# Patient Record
Sex: Female | Born: 1995 | Hispanic: No | Marital: Married | Smoking: Never smoker
Health system: Southern US, Community
[De-identification: ages and names within clinical notes are randomized; demographics above are authoritative.]

## PROBLEM LIST (undated history)

## (undated) ENCOUNTER — Inpatient Hospital Stay (HOSPITAL_COMMUNITY): Payer: Self-pay

## (undated) DIAGNOSIS — Z789 Other specified health status: Secondary | ICD-10-CM

## (undated) DIAGNOSIS — D649 Anemia, unspecified: Secondary | ICD-10-CM

## (undated) DIAGNOSIS — O24419 Gestational diabetes mellitus in pregnancy, unspecified control: Secondary | ICD-10-CM

## (undated) HISTORY — DX: Anemia, unspecified: D64.9

## (undated) HISTORY — DX: Gestational diabetes mellitus in pregnancy, unspecified control: O24.419

## (undated) HISTORY — PX: NO PAST SURGERIES: SHX2092

---

## 2016-12-23 ENCOUNTER — Inpatient Hospital Stay (HOSPITAL_COMMUNITY)
Admission: AD | Admit: 2016-12-23 | Discharge: 2016-12-23 | Disposition: A | Discharge status: Home / Self Care | Payer: BLUE CROSS/BLUE SHIELD | Source: Ambulatory Visit | Attending: Obstetrics & Gynecology | Admitting: Obstetrics & Gynecology

## 2016-12-23 ENCOUNTER — Encounter (HOSPITAL_COMMUNITY): Payer: Self-pay | Admitting: *Deleted

## 2016-12-23 ENCOUNTER — Inpatient Hospital Stay (HOSPITAL_COMMUNITY): Payer: BLUE CROSS/BLUE SHIELD

## 2016-12-23 DIAGNOSIS — O26851 Spotting complicating pregnancy, first trimester: Secondary | ICD-10-CM | POA: Diagnosis not present

## 2016-12-23 DIAGNOSIS — O26892 Other specified pregnancy related conditions, second trimester: Secondary | ICD-10-CM | POA: Insufficient documentation

## 2016-12-23 DIAGNOSIS — O3442 Maternal care for other abnormalities of cervix, second trimester: Secondary | ICD-10-CM | POA: Diagnosis not present

## 2016-12-23 DIAGNOSIS — M549 Dorsalgia, unspecified: Secondary | ICD-10-CM | POA: Diagnosis not present

## 2016-12-23 DIAGNOSIS — N841 Polyp of cervix uteri: Secondary | ICD-10-CM | POA: Diagnosis not present

## 2016-12-23 DIAGNOSIS — O26859 Spotting complicating pregnancy, unspecified trimester: Secondary | ICD-10-CM

## 2016-12-23 DIAGNOSIS — Z3491 Encounter for supervision of normal pregnancy, unspecified, first trimester: Secondary | ICD-10-CM

## 2016-12-23 DIAGNOSIS — O209 Hemorrhage in early pregnancy, unspecified: Secondary | ICD-10-CM | POA: Diagnosis present

## 2016-12-23 DIAGNOSIS — Z6790 Unspecified blood type, Rh positive: Secondary | ICD-10-CM | POA: Diagnosis not present

## 2016-12-23 DIAGNOSIS — Z3A01 Less than 8 weeks gestation of pregnancy: Secondary | ICD-10-CM | POA: Diagnosis not present

## 2016-12-23 HISTORY — DX: Other specified health status: Z78.9

## 2016-12-23 LAB — URINALYSIS, ROUTINE W REFLEX MICROSCOPIC
Bilirubin Urine: NEGATIVE
Glucose, UA: NEGATIVE mg/dL
Hgb urine dipstick: NEGATIVE
KETONES UR: NEGATIVE mg/dL
Leukocytes, UA: NEGATIVE
Nitrite: NEGATIVE
PH: 6 (ref 5.0–8.0)
PROTEIN: NEGATIVE mg/dL
Specific Gravity, Urine: 1.006 (ref 1.005–1.030)

## 2016-12-23 LAB — CBC
HCT: 32.9 % — ABNORMAL LOW (ref 36.0–46.0)
Hemoglobin: 11.8 g/dL — ABNORMAL LOW (ref 12.0–15.0)
MCH: 29.1 pg (ref 26.0–34.0)
MCHC: 35.9 g/dL (ref 30.0–36.0)
MCV: 81 fL (ref 78.0–100.0)
PLATELETS: 155 10*3/uL (ref 150–400)
RBC: 4.06 MIL/uL (ref 3.87–5.11)
RDW: 12.5 % (ref 11.5–15.5)
WBC: 6.1 10*3/uL (ref 4.0–10.5)

## 2016-12-23 LAB — HCG, QUANTITATIVE, PREGNANCY: HCG, BETA CHAIN, QUANT, S: 119140 m[IU]/mL — AB (ref <5)

## 2016-12-23 LAB — WET PREP, GENITAL
CLUE CELLS WET PREP: NONE SEEN
Sperm: NONE SEEN
TRICH WET PREP: NONE SEEN
Yeast Wet Prep HPF POC: NONE SEEN

## 2016-12-23 LAB — POCT PREGNANCY, URINE: Preg Test, Ur: POSITIVE — AB

## 2016-12-23 NOTE — MAU Note (Signed)
Found out preg.    lmp was Sept 15. Started bleeding 4 days, spotting. Not really any pain, back is a little sore. First visit is Nov 2, called office was told to come here.

## 2016-12-23 NOTE — MAU Provider Note (Signed)
History     CSN: [id number]  Arrival date and time: 12/23/16 1651   First Provider Initiated Contact with Patient 12/23/16 1746      Chief Complaint  Patient presents with  . Vaginal Bleeding  . Possible Pregnancy  . Back Pain   G1 @[redacted]w[redacted]d  by LMP here with spotting x4 days. Describes as pink or brown and enough to stain a pad. Denies abdominal or pelvic pain. No recent IC.    OB History    Gravida Para Term Preterm AB Living   1             SAB TAB Ectopic Multiple Live Births                  Past Medical History:  Diagnosis Date  . Medical history non-contributory     Past Surgical History:  Procedure Laterality Date  . NO PAST SURGERIES      Family History  Problem Relation Age of Onset  . Diabetes Brother   . Diabetes Paternal Grandfather   . Hypertension Paternal Grandfather     Social History  Substance Use Topics  . Smoking status: Never Smoker  . Smokeless tobacco: Never Used  . Alcohol use No    Allergies: No Known Allergies  No prescriptions prior to admission.    Review of Systems  Gastrointestinal: Negative for abdominal pain.  Genitourinary: Positive for vaginal bleeding.   Physical Exam   Blood pressure 121/79, pulse 92, temperature 98.3 F (36.8 C), temperature source Oral, resp. rate 16, height 5\' 3"  (1.6 m), weight 114 lb (51.7 kg), last menstrual period 11/09/2016, SpO2 100 %.  Physical Exam  Constitutional: She is oriented to person, place, and time. She appears well-developed and well-nourished. No distress.  HENT:  Head: Normocephalic and atraumatic.  Neck: Normal range of motion.  Respiratory: Effort normal. No respiratory distress.  GI: Soft. She exhibits no distension and no mass. There is no tenderness. There is no rebound and no guarding.  Genitourinary:  Genitourinary Comments: External: no lesions or erythema Vagina: rugated, pink, moist, scant brown discharge, ?cervical polyp protruding from os, cervix visually  closed Uterus: non enlarged, anteverted, non tender, no CMT Adnexae: no masses, no tenderness left, no tenderness right   Musculoskeletal: Normal range of motion.  Neurological: She is alert and oriented to person, place, and time.  Skin: Skin is warm and dry.  Psychiatric: She has a normal mood and affect.   Results for orders placed or performed during the hospital encounter of 12/23/16 (from the past 24 hour(s))  Urinalysis, Routine w reflex microscopic     Status: Abnormal   Collection Time: 12/23/16  5:22 PM  Result Value Ref Range   Color, Urine STRAW (A) YELLOW   APPearance CLEAR CLEAR   Specific Gravity, Urine 1.006 1.005 - 1.030   pH 6.0 5.0 - 8.0   Glucose, UA NEGATIVE NEGATIVE mg/dL   Hgb urine dipstick NEGATIVE NEGATIVE   Bilirubin Urine NEGATIVE NEGATIVE   Ketones, ur NEGATIVE NEGATIVE mg/dL   Protein, ur NEGATIVE NEGATIVE mg/dL   Nitrite NEGATIVE NEGATIVE   Leukocytes, UA NEGATIVE NEGATIVE   RBC / HPF 0-5 0 - 5 RBC/hpf   WBC, UA 0-5 0 - 5 WBC/hpf   Bacteria, UA RARE (A) NONE SEEN   Squamous Epithelial / LPF 0-5 (A) NONE SEEN   Mucus PRESENT   Pregnancy, urine POC     Status: Abnormal   Collection Time: 12/23/16  5:44 PM  Result  Value Ref Range   Preg Test, Ur POSITIVE (A) NEGATIVE  Wet prep, genital     Status: Abnormal   Collection Time: 12/23/16  5:56 PM  Result Value Ref Range   Yeast Wet Prep HPF POC NONE SEEN NONE SEEN   Trich, Wet Prep NONE SEEN NONE SEEN   Clue Cells Wet Prep HPF POC NONE SEEN NONE SEEN   WBC, Wet Prep HPF POC MANY (A) NONE SEEN   Sperm NONE SEEN   CBC     Status: Abnormal   Collection Time: 12/23/16  6:20 PM  Result Value Ref Range   WBC 6.1 4.0 - 10.5 K/uL   RBC 4.06 3.87 - 5.11 MIL/uL   Hemoglobin 11.8 (L) 12.0 - 15.0 g/dL   HCT 16.1 (L) 09.6 - 04.5 %   MCV 81.0 78.0 - 100.0 fL   MCH 29.1 26.0 - 34.0 pg   MCHC 35.9 30.0 - 36.0 g/dL   RDW 40.9 81.1 - 91.4 %   Platelets 155 150 - 400 K/uL  hCG, quantitative, pregnancy      Status: Abnormal   Collection Time: 12/23/16  6:20 PM  Result Value Ref Range   hCG, Beta Chain, Quant, S 119,140 (H) <5 mIU/mL  ABO/Rh     Status: None (Preliminary result)   Collection Time: 12/23/16  6:20 PM  Result Value Ref Range   ABO/RH(D) O POS    US Ob Comp Less 14 Wks  Result Date: 12/23/2016 CLINICAL DATA:  Vaginal bleeding. Early pregnancy. Estimated gestational age by last menstrual period equals 6 weeks 2 days EXAM: OBSTETRIC <14 WK [place] AND TRANSVAGINAL OB US TECHNIQUE: Both transabdominal and transvaginal ultrasound examinations were performed for complete evaluation of the gestation as well as the maternal uterus, adnexal regions, and pelvic cul-de-sac. Transvaginal technique was performed to assess early pregnancy. COMPARISON:  None. FINDINGS: Intrauterine gestational sac: Present Yolk sac:  Present Embryo:  Present Cardiac Activity: Present Heart Rate: 127  bpm CRL:  5.5  mm   6 w   2 d                  [place] EDC: 08/16/2017 Subchorionic hemorrhage:  None Maternal uterus/adnexae: RIGHT ovary not identified. LEFT ovary with corpus luteal cyst. Trace free fluid. IMPRESSION: 1. Single intrauterine gestation with embryo and normal cardiac activity. 2. Estimated gestational age by crown rump length equals 6 weeks 2 days. Electronically Signed   By: Genevive Bi M.D.   On: 12/23/2016 20:13   US Ob Transvaginal  Result Date: 12/23/2016 CLINICAL DATA:  Vaginal bleeding. Early pregnancy. Estimated gestational age by last menstrual period equals 6 weeks 2 days EXAM: OBSTETRIC <14 WK [place] AND TRANSVAGINAL OB US TECHNIQUE: Both transabdominal and transvaginal ultrasound examinations were performed for complete evaluation of the gestation as well as the maternal uterus, adnexal regions, and pelvic cul-de-sac. Transvaginal technique was performed to assess early pregnancy. COMPARISON:  None. FINDINGS: Intrauterine gestational sac: Present Yolk sac:  Present Embryo:  Present Cardiac Activity:  Present Heart Rate: 127  bpm CRL:  5.5  mm   6 w   2 d                  [place] EDC: 08/16/2017 Subchorionic hemorrhage:  None Maternal uterus/adnexae: RIGHT ovary not identified. LEFT ovary with corpus luteal cyst. Trace free fluid. IMPRESSION: 1. Single intrauterine gestation with embryo and normal cardiac activity. 2. Estimated gestational age by crown rump length equals 6 weeks 2 days.  Electronically Signed   By: Genevive BiStewart  Edmunds M.D.   On: 12/23/2016 20:13   MAU Course  Procedures  MDM Labs and US ordered and reviewed. Normal IUP on US. No SCH seen. Spotting could be caused by cervical polyp. Discussed findings with pt, reassured. Stable for discharge home.  Assessment and Plan   1. Normal intrauterine pregnancy on prenatal ultrasound in first trimester   2. Spotting affecting pregnancy   3. Blood type, Rh positive   4. Cervical polyp    Discharge home Follow up in Ob office in 4 days as planned SAB/bleeding precautions  Allergies as of 12/23/2016   No Known Allergies     Medication List    You have not been prescribed any medications.    Donette LarryMelanie Emersyn Kotarski, CNM 12/23/2016, 6:00 PM

## 2016-12-23 NOTE — Discharge Instructions (Signed)
Vaginal Bleeding During Pregnancy, First Trimester °A small amount of bleeding (spotting) from the vagina is common in early pregnancy. Sometimes the bleeding is normal and is not a problem, and sometimes it is a sign of something serious. Be sure to tell your doctor about any bleeding from your vagina right away. °Follow these instructions at home: °· Watch your condition for any changes. °· Follow your doctor's instructions about how active you can be. °· If you are on bed rest: °? You may need to stay in bed and only get up to use the bathroom. °? You may be allowed to do some activities. °? If you need help, make plans for someone to help you. °· Write down: °? The number of pads you use each day. °? How often you change pads. °? How soaked (saturated) your pads are. °· Do not use tampons. °· Do not douche. °· Do not have sex or orgasms until your doctor says it is okay. °· If you pass any tissue from your vagina, save the tissue so you can show it to your doctor. °· Only take medicines as told by your doctor. °· Do not take aspirin because it can make you bleed. °· Keep all follow-up visits as told by your doctor. °Contact a doctor if: °· You bleed from your vagina. °· You have cramps. °· You have labor pains. °· You have a fever that does not go away after you take medicine. °Get help right away if: °· You have very bad cramps in your back or belly (abdomen). °· You pass large clots or tissue from your vagina. °· You bleed more. °· You feel light-headed or weak. °· You pass out (faint). °· You have chills. °· You are leaking fluid or have a gush of fluid from your vagina. °· You pass out while pooping (having a bowel movement). °This information is not intended to replace advice given to you by your health care provider. Make sure you discuss any questions you have with your health care provider. °Document Released: 06/28/2013 Document Revised: 07/20/2015 Document Reviewed: 10/19/2012 °Elsevier Interactive  Patient Education © 2018 Elsevier Inc. ° °

## 2016-12-24 LAB — ABO/RH: ABO/RH(D): O POS

## 2016-12-24 LAB — GC/CHLAMYDIA PROBE AMP (~~LOC~~) NOT AT ARMC
CHLAMYDIA, DNA PROBE: NEGATIVE
NEISSERIA GONORRHEA: NEGATIVE

## 2016-12-27 LAB — OB RESULTS CONSOLE HEPATITIS B SURFACE ANTIGEN: HEP B S AG: NEGATIVE

## 2016-12-27 LAB — OB RESULTS CONSOLE HIV ANTIBODY (ROUTINE TESTING): HIV: NONREACTIVE

## 2016-12-27 LAB — OB RESULTS CONSOLE GC/CHLAMYDIA
Chlamydia: NEGATIVE
Gonorrhea: NEGATIVE

## 2016-12-27 LAB — OB RESULTS CONSOLE RPR: RPR: NONREACTIVE

## 2016-12-27 LAB — OB RESULTS CONSOLE ANTIBODY SCREEN: Antibody Screen: NEGATIVE

## 2016-12-27 LAB — OB RESULTS CONSOLE RUBELLA ANTIBODY, IGM: Rubella: IMMUNE

## 2016-12-27 LAB — OB RESULTS CONSOLE ABO/RH: RH TYPE: POSITIVE

## 2017-02-14 ENCOUNTER — Other Ambulatory Visit: Payer: Self-pay | Admitting: Obstetrics and Gynecology

## 2017-02-14 ENCOUNTER — Other Ambulatory Visit (HOSPITAL_COMMUNITY)
Admission: RE | Admit: 2017-02-14 | Discharge: 2017-02-14 | Disposition: A | Discharge status: Home / Self Care | Payer: BLUE CROSS/BLUE SHIELD | Source: Ambulatory Visit | Attending: Obstetrics and Gynecology | Admitting: Obstetrics and Gynecology

## 2017-02-14 DIAGNOSIS — Z124 Encounter for screening for malignant neoplasm of cervix: Secondary | ICD-10-CM | POA: Insufficient documentation

## 2017-02-25 NOTE — L&D Delivery Note (Signed)
Delivery Note At 2:14 PM a viable female was delivered via Vaginal, Spontaneous (Presentation: ;  Right occiput Anterior ).  APGAR: 8, 8; weight 5 lb 14.9 oz (2690 g).   Placenta status:  Complete 3 vessel .  Cord:  with the following complications None: .  Cord pH: NA  Anesthesia:None   Episiotomy: None Lacerations: Labial ( Right labial) Suture Repair: 3.0 vicryl Est. Blood Loss (mL): 200 cc    Mom to postpartum.  Baby to Couplet care / Skin to Skin.  Erika Kelly J. 08/06/2017, 5:29 PM

## 2017-02-26 LAB — CYTOLOGY - PAP
Chlamydia: NEGATIVE
DIAGNOSIS: NEGATIVE
NEISSERIA GONORRHEA: NEGATIVE

## 2017-05-21 ENCOUNTER — Ambulatory Visit: Payer: BLUE CROSS/BLUE SHIELD | Admitting: Registered"

## 2017-05-28 ENCOUNTER — Encounter: Payer: BLUE CROSS/BLUE SHIELD | Attending: Obstetrics and Gynecology | Admitting: Registered"

## 2017-05-28 DIAGNOSIS — O99810 Abnormal glucose complicating pregnancy: Secondary | ICD-10-CM | POA: Diagnosis not present

## 2017-05-28 DIAGNOSIS — Z3A00 Weeks of gestation of pregnancy not specified: Secondary | ICD-10-CM | POA: Insufficient documentation

## 2017-05-28 DIAGNOSIS — Z713 Dietary counseling and surveillance: Secondary | ICD-10-CM | POA: Insufficient documentation

## 2017-05-29 ENCOUNTER — Encounter: Payer: Self-pay | Admitting: Registered"

## 2017-05-29 DIAGNOSIS — O99810 Abnormal glucose complicating pregnancy: Secondary | ICD-10-CM | POA: Insufficient documentation

## 2017-05-29 NOTE — Progress Notes (Signed)
Patient was seen on 05/28/2017 for Gestational Diabetes self-management class at the [facility]. The following learning objectives were met by the patient during this course:   States the definition of Gestational Diabetes  States why dietary management is important in controlling blood glucose  Describes the effects each nutrient has on blood glucose levels  Demonstrates ability to create a balanced meal plan  Demonstrates carbohydrate counting   States when to check blood glucose levels  Demonstrates proper blood glucose monitoring techniques  States the effect of stress and exercise on blood glucose levels  States the importance of limiting caffeine and abstaining from alcohol and smoking  Blood glucose monitor given: none  Patient instructed to monitor glucose levels: FBS: 60 - <95; 1 hour: <140; 2 hour: <120  Patient received handouts:  Nutrition Diabetes and Pregnancy, including carb counting list  Patient will be seen for follow-up as needed.

## 2017-07-31 ENCOUNTER — Telehealth (HOSPITAL_COMMUNITY): Payer: Self-pay | Admitting: *Deleted

## 2017-07-31 ENCOUNTER — Encounter (HOSPITAL_COMMUNITY): Payer: Self-pay | Admitting: *Deleted

## 2017-07-31 NOTE — Telephone Encounter (Signed)
Preadmission screen  

## 2017-08-06 ENCOUNTER — Encounter (HOSPITAL_COMMUNITY): Payer: Self-pay | Admitting: *Deleted

## 2017-08-06 ENCOUNTER — Inpatient Hospital Stay (HOSPITAL_COMMUNITY)
Admission: AD | Admit: 2017-08-06 | Discharge: 2017-08-08 | DRG: 807 | Disposition: A | Discharge status: Home / Self Care | Payer: BLUE CROSS/BLUE SHIELD | Attending: Obstetrics and Gynecology | Admitting: Obstetrics and Gynecology

## 2017-08-06 DIAGNOSIS — O358XX0 Maternal care for other (suspected) fetal abnormality and damage, not applicable or unspecified: Secondary | ICD-10-CM | POA: Diagnosis present

## 2017-08-06 DIAGNOSIS — Z3A38 38 weeks gestation of pregnancy: Secondary | ICD-10-CM | POA: Diagnosis not present

## 2017-08-06 DIAGNOSIS — Z3483 Encounter for supervision of other normal pregnancy, third trimester: Secondary | ICD-10-CM | POA: Diagnosis present

## 2017-08-06 DIAGNOSIS — O365930 Maternal care for other known or suspected poor fetal growth, third trimester, not applicable or unspecified: Secondary | ICD-10-CM | POA: Diagnosis present

## 2017-08-06 DIAGNOSIS — O24420 Gestational diabetes mellitus in childbirth, diet controlled: Secondary | ICD-10-CM | POA: Diagnosis present

## 2017-08-06 LAB — CBC
HCT: 41.5 % (ref 36.0–46.0)
HEMOGLOBIN: 14.1 g/dL (ref 12.0–15.0)
MCH: 28.2 pg (ref 26.0–34.0)
MCHC: 34 g/dL (ref 30.0–36.0)
MCV: 83 fL (ref 78.0–100.0)
Platelets: 106 10*3/uL — ABNORMAL LOW (ref 150–400)
RBC: 5 MIL/uL (ref 3.87–5.11)
RDW: 13.8 % (ref 11.5–15.5)
WBC: 8.4 10*3/uL (ref 4.0–10.5)

## 2017-08-06 LAB — TYPE AND SCREEN
ABO/RH(D): O POS
Antibody Screen: NEGATIVE

## 2017-08-06 MED ORDER — OXYCODONE-ACETAMINOPHEN 5-325 MG PO TABS: 1.0000 | ORAL_TABLET | ORAL | Status: DC | PRN

## 2017-08-06 MED ORDER — SENNOSIDES-DOCUSATE SODIUM 8.6-50 MG PO TABS
2.0000 | ORAL_TABLET | ORAL | Status: DC
  Administered 2017-08-07 – 2017-08-08 (×2): 2 via ORAL
  Filled 2017-08-06 (×2): qty 2

## 2017-08-06 MED ORDER — OXYTOCIN 40 UNITS IN LACTATED RINGERS INFUSION - SIMPLE MED
INTRAVENOUS | Status: AC
  Filled 2017-08-06: qty 1000

## 2017-08-06 MED ORDER — DIPHENHYDRAMINE HCL 25 MG PO CAPS: 25.0000 mg | ORAL_CAPSULE | Freq: Four times a day (QID) | ORAL | Status: DC | PRN

## 2017-08-06 MED ORDER — PRENATAL MULTIVITAMIN CH
1.0000 | ORAL_TABLET | Freq: Every day | ORAL | Status: DC
  Administered 2017-08-07 – 2017-08-08 (×2): 1 via ORAL
  Filled 2017-08-06 (×2): qty 1

## 2017-08-06 MED ORDER — METHYLERGONOVINE MALEATE 0.2 MG/ML IJ SOLN: 0.2000 mg | INTRAMUSCULAR | Status: DC | PRN

## 2017-08-06 MED ORDER — OXYCODONE-ACETAMINOPHEN 5-325 MG PO TABS: 2.0000 | ORAL_TABLET | ORAL | Status: DC | PRN

## 2017-08-06 MED ORDER — FLEET ENEMA 7-19 GM/118ML RE ENEM: 1.0000 | ENEMA | RECTAL | Status: DC | PRN

## 2017-08-06 MED ORDER — ONDANSETRON HCL 4 MG/2ML IJ SOLN: 4.0000 mg | Freq: Four times a day (QID) | INTRAMUSCULAR | Status: DC | PRN

## 2017-08-06 MED ORDER — ONDANSETRON HCL 4 MG/2ML IJ SOLN: 4.0000 mg | INTRAMUSCULAR | Status: DC | PRN

## 2017-08-06 MED ORDER — SOD CITRATE-CITRIC ACID 500-334 MG/5ML PO SOLN: 30.0000 mL | ORAL | Status: DC | PRN

## 2017-08-06 MED ORDER — BENZOCAINE-MENTHOL 20-0.5 % EX AERO: 1.0000 "application " | INHALATION_SPRAY | CUTANEOUS | Status: DC | PRN

## 2017-08-06 MED ORDER — DIBUCAINE 1 % RE OINT: 1.0000 "application " | TOPICAL_OINTMENT | RECTAL | Status: DC | PRN

## 2017-08-06 MED ORDER — WITCH HAZEL-GLYCERIN EX PADS: 1.0000 "application " | MEDICATED_PAD | CUTANEOUS | Status: DC | PRN

## 2017-08-06 MED ORDER — IBUPROFEN 600 MG PO TABS
600.0000 mg | ORAL_TABLET | Freq: Four times a day (QID) | ORAL | Status: DC
  Administered 2017-08-08: 600 mg via ORAL
  Filled 2017-08-06 (×6): qty 1

## 2017-08-06 MED ORDER — OXYTOCIN BOLUS FROM INFUSION: 500.0000 mL | Freq: Once | INTRAVENOUS | Status: DC

## 2017-08-06 MED ORDER — LIDOCAINE HCL (PF) 1 % IJ SOLN
30.0000 mL | INTRAMUSCULAR | Status: DC | PRN
  Filled 2017-08-06: qty 30

## 2017-08-06 MED ORDER — LACTATED RINGERS IV SOLN
INTRAVENOUS | Status: DC
  Administered 2017-08-06: 14:00:00 via INTRAVENOUS

## 2017-08-06 MED ORDER — SIMETHICONE 80 MG PO CHEW: 80.0000 mg | CHEWABLE_TABLET | ORAL | Status: DC | PRN

## 2017-08-06 MED ORDER — MISOPROSTOL 200 MCG PO TABS
ORAL_TABLET | ORAL | Status: AC
  Filled 2017-08-06: qty 5

## 2017-08-06 MED ORDER — OXYTOCIN 40 UNITS IN LACTATED RINGERS INFUSION - SIMPLE MED: 2.5000 [IU]/h | INTRAVENOUS | Status: DC

## 2017-08-06 MED ORDER — OXYCODONE HCL 5 MG PO TABS: 10.0000 mg | ORAL_TABLET | ORAL | Status: DC | PRN

## 2017-08-06 MED ORDER — OXYTOCIN 10 UNIT/ML IJ SOLN
INTRAMUSCULAR | Status: AC
  Administered 2017-08-06: 10 [IU]
  Filled 2017-08-06: qty 1

## 2017-08-06 MED ORDER — ZOLPIDEM TARTRATE 5 MG PO TABS: 5.0000 mg | ORAL_TABLET | Freq: Every evening | ORAL | Status: DC | PRN

## 2017-08-06 MED ORDER — ACETAMINOPHEN 325 MG PO TABS: 650.0000 mg | ORAL_TABLET | ORAL | Status: DC | PRN

## 2017-08-06 MED ORDER — LIDOCAINE HCL (PF) 1 % IJ SOLN
INTRAMUSCULAR | Status: AC
  Filled 2017-08-06: qty 30

## 2017-08-06 MED ORDER — METHYLERGONOVINE MALEATE 0.2 MG PO TABS: 0.2000 mg | ORAL_TABLET | ORAL | Status: DC | PRN

## 2017-08-06 MED ORDER — COCONUT OIL OIL
1.0000 "application " | TOPICAL_OIL | Status: DC | PRN
  Administered 2017-08-07: 1 via TOPICAL
  Filled 2017-08-06: qty 120

## 2017-08-06 MED ORDER — LACTATED RINGERS IV SOLN: 500.0000 mL | INTRAVENOUS | Status: DC | PRN

## 2017-08-06 MED ORDER — ONDANSETRON HCL 4 MG PO TABS
4.0000 mg | ORAL_TABLET | ORAL | Status: DC | PRN
Start: 2017-08-06 — End: 2017-08-08

## 2017-08-06 MED ORDER — OXYCODONE HCL 5 MG PO TABS: 5.0000 mg | ORAL_TABLET | ORAL | Status: DC | PRN

## 2017-08-06 NOTE — Lactation Note (Signed)
This note was copied from a baby's chart. Lactation Consultation Note  Patient Name: Boy Hosie Spangleyesha Labrosse ZHYQM'VToday's Date: 08/06/2017 Reason for consult: Initial assessment;Primapara;1st time breastfeeding;Early term 37-38.6wks;Infant < 6lbs  P1 mother whose infant is now 988 hours old.  This is an ETI at 38+4 weeks and weighs 4+14.9 oz.  Baby in bassinet and swaddled when I arrived.  Mother requested latch assistance.  Mother's breasts are soft and non tender and nipples are shor Maternal Data Formula Feeding for Exclusion: No Has patient been taught Hand Expression?: Yes Does the patient have breastfeeding experience prior to this delivery?: No  Feeding Feeding Type: Breast Fed Length of feed: 0 min  LATCH Score Latch: Too sleepy or reluctant, no latch achieved, no sucking elicited.  Audible Swallowing: None  Type of Nipple: Everted at rest and after stimulation  Comfort (Breast/Nipple): Soft / non-tender  Hold (Positioning): Assistance needed to correctly position infant at breast and maintain latch.  LATCH Score: 5  Interventions Interventions: Breast feeding basics reviewed;Assisted with latch;Skin to skin;Breast massage;Hand express;Position options;Support pillows;Adjust position;Breast compression  Lactation Tools Discussed/Used     Consult Status Consult Status: Follow-up Date: 08/07/17 Follow-up type: In-patient    Dora SimsBeth R Agam Davenport 08/06/2017, 10:43 PM

## 2017-08-06 NOTE — MAU Note (Signed)
First baby, gest diabetic diet controlled.  Started contracting earlier this morning.  Bloody show.

## 2017-08-06 NOTE — H&P (Signed)
Erika Kelly is a 22 y.o. female G1 P0 at 38 wks and  4 days  prHosie Spangleesenting for active labor. She progressed rapidly from 7 cm to complete. Pregnancy complicated by mild fetal pyelectasis , IUGR  and A1GDM. Prenatal care provided by Dr. Gerald Leitzara Anthonee Gelin [facility]   . OB History    Gravida  1   Para      Term      Preterm      AB      Living        SAB      TAB      Ectopic      Multiple      Live Births             Past Medical History:  Diagnosis Date  . Gestational diabetes   . Medical history non-contributory    Past Surgical History:  Procedure Laterality Date  . NO PAST SURGERIES     Family History: family history includes Diabetes in her brother and paternal grandfather; Heart disease in her maternal grandfather; Hypertension in her maternal grandfather, maternal grandmother, and paternal grandfather. Social History:  reports that she has never smoked. She has never used smokeless tobacco. She reports that she does not drink alcohol or use drugs.     Maternal Diabetes: Yes:  Diabetes Type:  Diet controlled Genetic Screening: Declined Maternal Ultrasounds/Referrals: Abnormal:  Findings:   Fetal renal pyelectasis Fetal Ultrasounds or other Referrals:  None Maternal Substance Abuse:  No Significant Maternal Medications:  None Significant Maternal Lab Results:  Lab values include: Group B Strep negative Other Comments:  None  Review of Systems  Constitutional: Negative.   HENT: Negative.   Eyes: Negative.   Respiratory: Negative.   Cardiovascular: Negative.   Gastrointestinal: Positive for abdominal pain.  Genitourinary: Negative.   Musculoskeletal: Negative.   Skin: Negative.   Neurological: Negative.   Endo/Heme/Allergies: Negative.   Psychiatric/Behavioral: Negative.    History Dilation: 10 Effacement (%): 100 Station: Plus 2 Exam by:: Hubert AzureM. WIlkins, RNC Blood pressure 113/77, pulse 81, temperature 98.4 F (36.9 C), resp. rate 18, height 5'  3" (1.6 m), weight 59 kg (130 lb), last menstrual period 11/09/2016. Exam Physical Exam  Constitutional: She is oriented to person, place, and time. She appears well-developed and well-nourished.  Eyes: Pupils are equal, round, and reactive to light. Conjunctivae are normal.  Neck: Normal range of motion. Neck supple.  Cardiovascular: Normal rate and regular rhythm.  Respiratory: Effort normal and breath sounds normal.  GI: There is no tenderness.  Genitourinary: Vagina normal.  Musculoskeletal: Normal range of motion. She exhibits no edema.  Neurological: She is alert and oriented to person, place, and time.  Skin: Skin is warm and dry.  Psychiatric: She has a normal mood and affect.    Prenatal labs: ABO, Rh: --/--/O POS (06/12 1400) Antibody: NEG (06/12 1400) Rubella: Immune (11/02 0000) RPR: Nonreactive (11/02 0000)  HBsAg: Negative (11/02 0000)  HIV: Non-reactive (11/02 0000)  GBS:   Negative  Assessment/Plan: 38 wks and 4 days  In active labor  Precipitous delivery upon arrival - see delivery summary A1GDM- check fasting BS in am .. Routine diet post delivery     Sho Salguero J. 08/06/2017, 5:22 PM

## 2017-08-06 NOTE — Lactation Note (Signed)
This note was copied from a baby's chart. Lactation Consultation Note  Patient Name: Erika Kelly UJWJX'BToday's Date: 08/06/2017 Reason for consult: Initial assessment;Primapara;1st time breastfeeding;Early term 37-38.6wks;Infant < 6lbs  P1 mother whose infant is now 338 hours old.  This is an ETI at 38+4 weeks and 5+14.9 oz  Mother is requesting latch assistance.  Mother's breasts are soft and non tender with short everted nipples.  Mother demonstrated hand expression and colostrum drops easily expressed.  Attempted to latch in the football hold on the left breast.  Baby could grasp breast but was not interested in sucking.  Finger fed a few drops of colostrum and attempted to latch again without success.  He is sleepy now and explained to mother that this is very normal.  Reviewed feeding cues with her and she will attempt to feed again when he shows cues.  Encouraged 8-12 feedings/24 hours or more if feeding cues are displayed.  STS, breast massage and hand expression reinforced.  Discussed voiding and stooling patterns of the newborn.  Reminded mother to keep feeding log current throughout the night.  Mother will call for assistance as needed.  Mom made aware of O/P services, breastfeeding support groups, community resources, and our phone # for post-discharge questions. Father present at the end of the feeding and updated.    Maternal Data Formula Feeding for Exclusion: No Has patient been taught Hand Expression?: Yes Does the patient have breastfeeding experience prior to this delivery?: No  Feeding Feeding Type: Breast Fed Length of feed: 0 min  LATCH Score Latch: Too sleepy or reluctant, no latch achieved, no sucking elicited.  Audible Swallowing: None  Type of Nipple: Everted at rest and after stimulation  Comfort (Breast/Nipple): Soft / non-tender  Hold (Positioning): Assistance needed to correctly position infant at breast and maintain latch.  LATCH Score:  5  Interventions Interventions: Breast feeding basics reviewed;Assisted with latch;Skin to skin;Breast massage;Hand express;Position options;Support pillows;Adjust position;Breast compression  Lactation Tools Discussed/Used     Consult Status Consult Status: Follow-up Date: 08/07/17 Follow-up type: In-patient    Dora SimsBeth R Marisol Glazer 08/06/2017, 10:46 PM

## 2017-08-07 ENCOUNTER — Inpatient Hospital Stay (HOSPITAL_COMMUNITY): Admission: RE | Admit: 2017-08-07 | Payer: BLUE CROSS/BLUE SHIELD | Source: Ambulatory Visit

## 2017-08-07 LAB — CBC
HEMATOCRIT: 36.2 % (ref 36.0–46.0)
Hemoglobin: 12.3 g/dL (ref 12.0–15.0)
MCH: 28.3 pg (ref 26.0–34.0)
MCHC: 34 g/dL (ref 30.0–36.0)
MCV: 83.2 fL (ref 78.0–100.0)
PLATELETS: 110 10*3/uL — AB (ref 150–400)
RBC: 4.35 MIL/uL (ref 3.87–5.11)
RDW: 13.6 % (ref 11.5–15.5)
WBC: 11.8 10*3/uL — AB (ref 4.0–10.5)

## 2017-08-07 LAB — RPR: RPR Ser Ql: NONREACTIVE

## 2017-08-07 NOTE — Discharge Instructions (Signed)
Postpartum Care After Vaginal Delivery °The period of time right after you deliver your newborn is called the postpartum period. °What kind of medical care will I receive? °· You may continue to receive fluids and medicines through an IV tube inserted into one of your veins. °· If an incision was made near your vagina (episiotomy) or if you had some vaginal tearing during delivery, cold compresses may be placed on your episiotomy or your tear. This helps to reduce pain and swelling. °· You may be given a squirt bottle to use when you go to the bathroom. You may use this until you are comfortable wiping as usual. To use the squirt bottle, follow these steps: °? Before you urinate, fill the squirt bottle with warm water. Do not use hot water. °? After you urinate, while you are sitting on the toilet, use the squirt bottle to rinse the area around your urethra and vaginal opening. This rinses away any urine and blood. °? You may do this instead of wiping. As you start healing, you may use the squirt bottle before wiping yourself. Make sure to wipe gently. °? Fill the squirt bottle with clean water every time you use the bathroom. °· You will be given sanitary pads to wear. °How can I expect to feel? °· You may not feel the need to urinate for several hours after delivery. °· You will have some soreness and pain in your abdomen and vagina. °· If you are breastfeeding, you may have uterine contractions every time you breastfeed for up to several weeks postpartum. Uterine contractions help your uterus return to its normal size. °· It is normal to have vaginal bleeding (lochia) after delivery. The amount and appearance of lochia is often similar to a menstrual period in the first week after delivery. It will gradually decrease over the next few weeks to a dry, yellow-brown discharge. For most women, lochia stops completely by 6-8 weeks after delivery. Vaginal bleeding can vary from woman to woman. °· Within the first few  days after delivery, you may have breast engorgement. This is when your breasts feel heavy, full, and uncomfortable. Your breasts may also throb and feel hard, tightly stretched, warm, and tender. After this occurs, you may have milk leaking from your breasts. Your health care provider can help you relieve discomfort due to breast engorgement. Breast engorgement should go away within a few days. °· You may feel more sad or worried than normal due to hormonal changes after delivery. These feelings should not last more than a few days. If these feelings do not go away after several days, speak with your health care provider. °How should I care for myself? °· Tell your health care provider if you have pain or discomfort. °· Drink enough water to keep your urine clear or pale yellow. °· Wash your hands thoroughly with soap and water for at least 20 seconds after changing your sanitary pads, after using the toilet, and before holding or feeding your baby. °· If you are not breastfeeding, avoid touching your breasts a lot. Doing this can make your breasts produce more milk. °· If you become weak or lightheaded, or you feel like you might faint, ask for help before: °? Getting out of bed. °? Showering. °· Change your sanitary pads frequently. Watch for any changes in your flow, such as a sudden increase in volume, a change in color, the passing of large blood clots. If you pass a blood clot from your vagina, save it   to show to your health care provider. Do not flush blood clots down the toilet without having your health care provider look at them. °· Make sure that all your vaccinations are up to date. This can help protect you and your baby from getting certain diseases. You may need to have immunizations done before you leave the hospital. °· If desired, talk with your health care provider about methods of family planning or birth control (contraception). °How can I start bonding with my baby? °Spending as much time as  possible with your baby is very important. During this time, you and your baby can get to know each other and develop a bond. Having your baby stay with you in your room (rooming in) can give you time to get to know your baby. Rooming in can also help you become comfortable caring for your baby. Breastfeeding can also help you bond with your baby. °How can I plan for returning home with my baby? °· Make sure that you have a car seat installed in your vehicle. °? Your car seat should be checked by a certified car seat installer to make sure that it is installed safely. °? Make sure that your baby fits into the car seat safely. °· Ask your health care provider any questions you have about caring for yourself or your baby. Make sure that you are able to contact your health care provider with any questions after leaving the hospital. °This information is not intended to replace advice given to you by your health care provider. Make sure you discuss any questions you have with your health care provider. °Document Released: 12/09/2006 Document Revised: 07/17/2015 Document Reviewed: 01/16/2015 °Elsevier Interactive Patient Education © 2018 Elsevier Inc. ° °

## 2017-08-07 NOTE — Progress Notes (Signed)
Post Partum Day 1 Subjective: no complaints, up ad lib, voiding, tolerating PO and + flatus  Objective: Blood pressure 114/82, pulse 76, temperature 98.5 F (36.9 C), temperature source Oral, resp. rate 18, height 5\' 3"  (1.6 m), weight 59 kg (130 lb), last menstrual period 11/09/2016, SpO2 100 %.  Physical Exam:  General: alert, cooperative and no distress Lochia: appropriate Uterine Fundus: firm Incision: NA DVT Evaluation: No evidence of DVT seen on physical exam.  Recent Labs    08/06/17 1400 08/07/17 0544  HGB 14.1 12.3  HCT 41.5 36.2    Assessment/Plan: Breastfeeding and Circumcision prior to discharge   Plan for discharge home tomorrow    LOS: 1 day   Jakub Debold J. 08/07/2017, 4:13 PM

## 2017-08-08 ENCOUNTER — Encounter (HOSPITAL_COMMUNITY): Payer: Self-pay

## 2017-08-08 MED ORDER — IBUPROFEN 600 MG PO TABS: 600.0000 mg | ORAL_TABLET | Freq: Four times a day (QID) | ORAL | 1 refills | Status: DC

## 2017-08-08 NOTE — Discharge Summary (Signed)
OB Discharge Summary     Patient Name: Erika Kelly DOB: 12/26/1995 MRN: [id number]  Date of admission: 08/06/2017 Delivering MD: Gerald Leitz   Date of discharge: 08/08/2017  Admitting diagnosis: LABOR Intrauterine pregnancy: [redacted]w[redacted]d     Secondary diagnosis:  Active Problems:   Normal labor  Additional problems: None     Discharge diagnosis: Term Pregnancy Delivered                                                                                                Post partum procedures:None  Augmentation: None  Complications: None  Hospital course:  Onset of Labor With Vaginal Delivery     22 y.o. yo G1P0 at [redacted]w[redacted]d was admitted in Active Labor on 08/06/2017. Patient had an uncomplicated labor course as follows:  Membrane Rupture Time/Date: 2:04 PM ,08/06/2017   Intrapartum Procedures: Episiotomy: None [1]                                         Lacerations:  Labial [10]  Patient had a delivery of a Viable infant. 08/06/2017  Information for the patient's newborn:  Toniqua, Melamed [[id number]]       Pateint had an uncomplicated postpartum course.  She is ambulating, tolerating a regular diet, passing flatus, and urinating well. Patient is discharged home in stable condition on 08/08/17.   Physical exam  Vitals:   08/07/17 0500 08/07/17 1533 08/07/17 2158 08/08/17 0600  BP: 119/71 114/82 107/66 110/68  Pulse: 74 76 74 73  Resp: 16 18 18 16   Temp: 98.4 F (36.9 C) 98.5 F (36.9 C) 98 F (36.7 C) 97.6 F (36.4 C)  TempSrc: Oral Oral Oral Oral  SpO2: 100%   100%  Weight:      Height:       General: alert, cooperative and no distress Lochia: appropriate Uterine Fundus: firm, at umbilicus Incision: N/A DVT Evaluation: No evidence of DVT seen on physical exam. Minimal edema  Labs: Lab Results  Component Value Date   WBC 11.8 (H) 08/07/2017   HGB 12.3 08/07/2017   HCT 36.2 08/07/2017   MCV 83.2 08/07/2017   PLT 110 (L) 08/07/2017   No flowsheet data  found.  Discharge instruction: per After Visit Summary and "Baby and Me Booklet".  After visit meds:  Allergies as of 08/08/2017   No Known Allergies     Medication List    TAKE these medications   benzoyl peroxide 4 % gel Commonly known as:  BREVOXYL Apply 1 application topically daily as needed (For acne.).   ibuprofen 600 MG tablet Commonly known as:  ADVIL,MOTRIN Take 1 tablet (600 mg total) by mouth every 6 (six) hours.   prenatal multivitamin Tabs tablet Take 1 tablet by mouth at bedtime.       Diet: routine diet  Activity: Advance as tolerated. Pelvic rest for 6 weeks.   Outpatient follow up:6 weeks Follow up Appt:No future appointments. Follow up Visit:No follow-ups on file.  Postpartum contraception: Undecided  Newborn Data: Live born female  Birth Weight: 5 lb 14.9 oz (2690 g) APGAR: 8, 8  Newborn Delivery   Birth date/time:  08/06/2017 14:14:00 Delivery type:  Vaginal, Spontaneous     Baby Feeding: Breast Disposition:home with mother   08/08/2017 Geryl RankinsEvelyn Kyros Salzwedel, MD

## 2017-08-08 NOTE — Lactation Note (Signed)
This note was copied from a baby's chart. Lactation Consultation Note Baby 2038 hrs old. Mom not supplementing enough. Discussed giving colostrum first then formula to equal amount needed. Reviewed amount according to hours of age. Mom stated she understood. Mom has round filling breast. Noted a few small knots. Demonstrated massage and hand expression. Encouraged mom to massage and pump breast. Mom stated she has used hand pump. Encouraged to use DEBP d/t baby's weight and need for stimulation and supplementation. Mom agreed and started pumping. Colostrum noted. Praised mom. Coconut oil applied to nipples and flanges prior to pumping. Nipples getting sore. Encouraged colostrum to nipples. Mom has small nipples, 21 flanges given. Mom had #20 NS. A lot of rm noted in NS. Fitted #16 NS. Baby sleeping and had formula, not hungry at this time. Encouraged mom to use NS that feels comfortable and see the most colostrum.  Stressed importance of I&O and STS.  Encouraged to call for questions or assistance.  Patient Name: Erika Hosie Spangleyesha Delehanty WUJWJ'XToday's Date: 08/08/2017 Reason for consult: Follow-up assessment;Early term 37-38.6wks;Infant < 6lbs   Maternal Data    Feeding    LATCH Score Latch: (instructed to call for latch)     Type of Nipple: Everted at rest and after stimulation(very short shaft)  Comfort (Breast/Nipple): Filling, red/small blisters or bruises, mild/mod discomfort        Interventions Interventions: Breast feeding basics reviewed;Support pillows;Position options;Skin to skin;Breast massage;Coconut oil;Shells;Hand express;Pre-pump if needed;Hand pump;DEBP;Breast compression  Lactation Tools Discussed/Used Tools: Shells;Pump;Flanges;Coconut oil;Nipple Shields Nipple shield size: 16 Flange Size: 21 Shell Type: Inverted Breast pump type: Double-Electric Breast Pump Pump Review: Setup, frequency, and cleaning;Milk Storage Initiated by:: Peri JeffersonL. Jones Viviani RN IBCLC Date initiated::  08/08/17   Consult Status Consult Status: Follow-up Date: 08/08/17 Follow-up type: In-patient    Charyl DancerCARVER, [name] 08/08/2017, 4:43 AM

## 2017-08-08 NOTE — Lactation Note (Signed)
This note was copied from a baby's chart. Lactation Consultation Note  Patient Name: [name] ZOXWR'UToday's Date: 08/08/2017   P1, Baby 43 hours old.  Baby < 6 lbs. Mother states she recently breastfed infant and pumped 15 ml from second breast and gave to baby w/ slow flow nipple. Encouraged her to continue to post pump and give baby back volume pumped by pumping both at the same time. [facility]. Suggest mother call for LC to view latch.  Reviewed engorgement care and monitoring voids/stools. Mom encouraged to feed baby 8-12 times/24 hours and with feeding cues at least q 3 hours.       Maternal Data    Feeding Feeding Type: Breast Milk Nipple Type: Slow - flow  LATCH Score                   Interventions    Lactation Tools Discussed/Used     Consult Status      Erika Kelly, Erika Kelly 08/08/2017, 10:06 AM

## 2017-08-31 ENCOUNTER — Encounter (HOSPITAL_COMMUNITY): Payer: Self-pay | Admitting: *Deleted

## 2017-08-31 ENCOUNTER — Inpatient Hospital Stay (HOSPITAL_COMMUNITY)
Admission: AD | Admit: 2017-08-31 | Discharge: 2017-08-31 | Disposition: A | Discharge status: Home / Self Care | Payer: BLUE CROSS/BLUE SHIELD | Source: Ambulatory Visit | Attending: Obstetrics and Gynecology | Admitting: Obstetrics and Gynecology

## 2017-08-31 DIAGNOSIS — N938 Other specified abnormal uterine and vaginal bleeding: Secondary | ICD-10-CM

## 2017-08-31 LAB — CBC WITH DIFFERENTIAL/PLATELET
BASOS ABS: 0 10*3/uL (ref 0.0–0.1)
Basophils Relative: 0 %
EOS ABS: 0.1 10*3/uL (ref 0.0–0.7)
EOS PCT: 2 %
HCT: 38.3 % (ref 36.0–46.0)
HEMOGLOBIN: 12.8 g/dL (ref 12.0–15.0)
Lymphocytes Relative: 37 %
Lymphs Abs: 2.5 10*3/uL (ref 0.7–4.0)
MCH: 28 pg (ref 26.0–34.0)
MCHC: 33.4 g/dL (ref 30.0–36.0)
MCV: 83.8 fL (ref 78.0–100.0)
Monocytes Absolute: 0.2 10*3/uL (ref 0.1–1.0)
Monocytes Relative: 3 %
NEUTROS PCT: 58 %
Neutro Abs: 3.9 10*3/uL (ref 1.7–7.7)
PLATELETS: 170 10*3/uL (ref 150–400)
RBC: 4.57 MIL/uL (ref 3.87–5.11)
RDW: 12.8 % (ref 11.5–15.5)
WBC: 6.8 10*3/uL (ref 4.0–10.5)

## 2017-08-31 LAB — URINALYSIS, ROUTINE W REFLEX MICROSCOPIC
BACTERIA UA: NONE SEEN
Bilirubin Urine: NEGATIVE
Glucose, UA: NEGATIVE mg/dL
Ketones, ur: NEGATIVE mg/dL
LEUKOCYTES UA: NEGATIVE
Nitrite: NEGATIVE
PROTEIN: NEGATIVE mg/dL
Specific Gravity, Urine: 1.005 (ref 1.005–1.030)
pH: 5 (ref 5.0–8.0)

## 2017-08-31 LAB — POCT PREGNANCY, URINE: PREG TEST UR: NEGATIVE

## 2017-08-31 NOTE — MAU Provider Note (Signed)
Chief Complaint: Vaginal Bleeding   None    SUBJECTIVE HPI: Erika Kelly is a 22 y.o. G1P1001 at Unknown who presents to Maternity Admissions reporting 4 weeks postpartum with increase in bleeding.  Pt is currently bottle feeding, quit breastfeeding.  Denies dizziness.  Denies urinary urgency or burning.  Pt not on birthcontrol.  Location: vaginal bleeding Quality: small clots Severity: 0/10 on pain scale Duration: 2 days Modifying factors:Has tried no otc   Past Medical History:  Diagnosis Date  . Gestational diabetes   . Medical history non-contributory    OB History  Gravida Para Term Preterm AB Living  1 1 1     1   SAB TAB Ectopic Multiple Live Births          1    # Outcome Date GA Lbr Len/2nd Weight Sex Delivery Anes PTL Lv  1 Term 08/06/17 1418w4d  2.665 kg (5 lb 14 oz) M Vag-Spont  N LIV   Past Surgical History:  Procedure Laterality Date  . NO PAST SURGERIES     Social History   Socioeconomic History  . Marital status: Married    Spouse name: Not on file  . Number of children: Not on file  . Years of education: Not on file  . Highest education level: Not on file  Occupational History  . Not on file  Social Needs  . Financial resource strain: Not on file  . Food insecurity:    Worry: Not on file    Inability: Not on file  . Transportation needs:    Medical: Not on file    Non-medical: Not on file  Tobacco Use  . Smoking status: Never Smoker  . Smokeless tobacco: Never Used  Substance and Sexual Activity  . Alcohol use: No  . Drug use: No  . Sexual activity: Yes    Birth control/protection: None  Lifestyle  . Physical activity:    Days per week: Not on file    Minutes per session: Not on file  . Stress: Not on file  Relationships  . Social connections:    Talks on phone: Not on file    Gets together: Not on file    Attends religious service: Not on file    Active member of club or organization: Not on file    Attends meetings of clubs or  organizations: Not on file    Relationship status: Not on file  . Intimate partner violence:    Fear of current or ex partner: Not on file    Emotionally abused: Not on file    Physically abused: Not on file    Forced sexual activity: Not on file  Other Topics Concern  . Not on file  Social History Narrative  . Not on file   Family History  Problem Relation Age of Onset  . Diabetes Brother   . Diabetes Paternal Grandfather   . Hypertension Paternal Grandfather   . Hypertension Maternal Grandmother   . Heart disease Maternal Grandfather   . Hypertension Maternal Grandfather    No current facility-administered medications on file prior to encounter.    Current Outpatient Medications on File Prior to Encounter  Medication Sig Dispense Refill  . benzoyl peroxide (BREVOXYL) 4 % gel Apply 1 application topically daily as needed (For acne.).    Marland Kitchen. ibuprofen (ADVIL,MOTRIN) 600 MG tablet Take 1 tablet (600 mg total) by mouth every 6 (six) hours. 30 tablet 1  . Prenatal Vit-Fe Fumarate-FA (PRENATAL MULTIVITAMIN) TABS tablet Take  1 tablet by mouth at bedtime.     No Known Allergies  I have reviewed patient's Past Medical Hx, Surgical Hx, Family Hx, Social Hx, medications and allergies.   Review of Systems  Constitutional: Negative.   HENT: Negative.   Eyes: Negative.   Respiratory: Negative.   Cardiovascular: Negative.   Gastrointestinal: Negative.   Endocrine: Negative.   Genitourinary: Positive for vaginal bleeding.  Musculoskeletal: Negative.   Skin: Negative.   Allergic/Immunologic: Negative.   Neurological: Negative.   Hematological: Negative.   Psychiatric/Behavioral: Negative.     OBJECTIVE Patient Vitals for the past 24 hrs:  BP Temp Pulse Resp Height Weight  08/31/17 1828 134/85 98.6 F (37 C) 92 16 5\' 4"  (1.626 m) 54 kg (119 lb)   Constitutional: Well-developed, well-nourished female in no acute distress.  Cardiovascular: normal rate Respiratory: normal rate  and effort.  GI: Abd soft, non-tender, gravid appropriate for gestational age. Pos BS x 4 MS: Extremities nontender, no edema, normal ROM Neurologic: Alert and oriented x 4.  GU: Neg CVAT.  SPECULUM EXAM: NEFG, physiologic discharge, small clot noted with small amount of blood in vault cervix clean  BIMANUAL: cervix no lesions; uterus involuted, no adnexal tenderness or masses.  No CMT.  LAB RESULTS Results for orders placed or performed during the hospital encounter of 08/31/17 (from the past 24 hour(s))  Pregnancy, urine POC     Status: None   Collection Time: 08/31/17  6:21 PM  Result Value Ref Range   Preg Test, Ur NEGATIVE NEGATIVE  Urinalysis, Routine w reflex microscopic     Status: Abnormal   Collection Time: 08/31/17  6:23 PM  Result Value Ref Range   Color, Urine STRAW (A) YELLOW   APPearance CLEAR CLEAR   Specific Gravity, Urine 1.005 1.005 - 1.030   pH 5.0 5.0 - 8.0   Glucose, UA NEGATIVE NEGATIVE mg/dL   Hgb urine dipstick LARGE (A) NEGATIVE   Bilirubin Urine NEGATIVE NEGATIVE   Ketones, ur NEGATIVE NEGATIVE mg/dL   Protein, ur NEGATIVE NEGATIVE mg/dL   Nitrite NEGATIVE NEGATIVE   Leukocytes, UA NEGATIVE NEGATIVE   RBC / HPF 21-50 0 - 5 RBC/hpf   WBC, UA 0-5 0 - 5 WBC/hpf   Bacteria, UA NONE SEEN NONE SEEN  CBC with Differential/Platelet     Status: None   Collection Time: 08/31/17  6:29 PM  Result Value Ref Range   WBC 6.8 4.0 - 10.5 K/uL   RBC 4.57 3.87 - 5.11 MIL/uL   Hemoglobin 12.8 12.0 - 15.0 g/dL   HCT 16.1 09.6 - 04.5 %   MCV 83.8 78.0 - 100.0 fL   MCH 28.0 26.0 - 34.0 pg   MCHC 33.4 30.0 - 36.0 g/dL   RDW 40.9 81.1 - 91.4 %   Platelets 170 150 - 400 K/uL   Neutrophils Relative % 58 %   Neutro Abs 3.9 1.7 - 7.7 K/uL   Lymphocytes Relative 37 %   Lymphs Abs 2.5 0.7 - 4.0 K/uL   Monocytes Relative 3 %   Monocytes Absolute 0.2 0.1 - 1.0 K/uL   Eosinophils Relative 2 %   Eosinophils Absolute 0.1 0.0 - 0.7 K/uL   Basophils Relative 0 %   Basophils  Absolute 0.0 0.0 - 0.1 K/uL    IMAGING No results found.  MAU COURSE Orders Placed This Encounter  Procedures  . Urinalysis, Routine w reflex microscopic  . CBC with Differential/Platelet  . Pregnancy, urine POC  . Discharge patient Discharge disposition:  01-Home or Self Care; Discharge patient date: 08/31/2017   No orders of the defined types were placed in this encounter.   MDM PE and pelvic.  hgb wnl.  Pt stable. Will discharge home. ASSESSMENT 1. Moderate vaginal bleeding   2. Menstrual period  PLAN Discussed normal return of menstrual cycle.  Need of birth control for intercourse. Follow up for postpartum exam. Discharge home in stable condition.  Follow-up Information    Gynecology, [facility] Follow up in 2 week(s).   Specialty:  Obstetrics and Gynecology Contact information: [address] [place] [address] [phone]          Allergies as of 08/31/2017   No Known Allergies     Medication List    STOP taking these medications   benzoyl peroxide 4 % gel Commonly known as:  BREVOXYL     TAKE these medications   ibuprofen 600 MG tablet Commonly known as:  ADVIL,MOTRIN Take 1 tablet (600 mg total) by mouth every 6 (six) hours.   prenatal multivitamin Tabs tablet Take 1 tablet by mouth at bedtime.        Kenney Houseman, CNM 08/31/2017  8:11 PM

## 2017-08-31 NOTE — MAU Note (Signed)
Pt presents to MAU with complaints of heavy vaginal bleeding, Pt states she delivered vaginally on June the 12th. States her vaginal bleeding started to slow down but she started passing clots today.

## 2019-02-26 NOTE — L&D Delivery Note (Signed)
Delivery Note At 6:37 AM a viable female was delivered via Vaginal, Spontaneous (Presentation: Left Occiput Anterior).  APGAR: 9, 9; weight  .   Placenta status: Spontaneous, Intact.  Cord: 3 vessels with the following complications: None.  Cord pH: n/a  Anesthesia: None Episiotomy: None Lacerations: None Suture Repair: n/a Est. Blood Loss (mL): 100  Mom to postpartum.  Baby to Couplet care / Skin to Skin.  Sharon Seller 11/09/2019, 7:23 AM

## 2019-09-10 IMAGING — US US OB TRANSVAGINAL
1 series · 15 of 28 positions shown · non-contrast
Comparison: None.

CLINICAL DATA: Vaginal bleeding. Early pregnancy. Estimated
gestational age by last menstrual period equals 6 weeks 2 days

EXAM:
OBSTETRIC <14 WK US AND TRANSVAGINAL OB US
TECHNIQUE: Both transabdominal and transvaginal ultrasound examinations were
performed for complete evaluation of the gestation as well as the
maternal uterus, adnexal regions, and pelvic cul-de-sac.
Transvaginal technique was performed to assess early pregnancy.

[Series 1: us ob transvaginal · 15 of 65 slices shown]
[im 1/65]
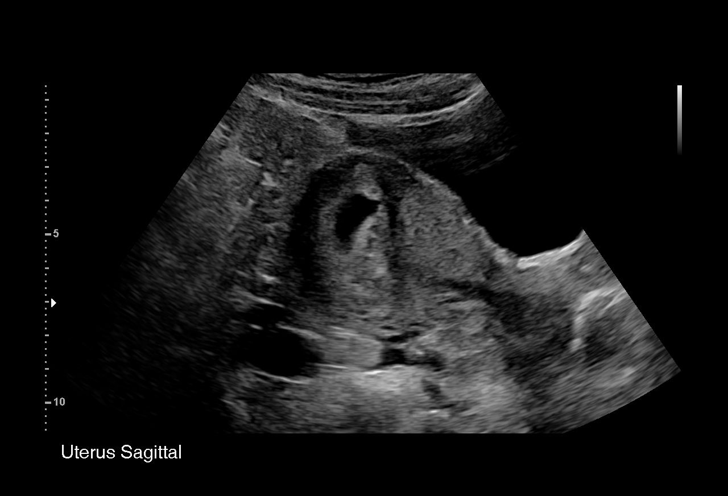
[im 5/65]
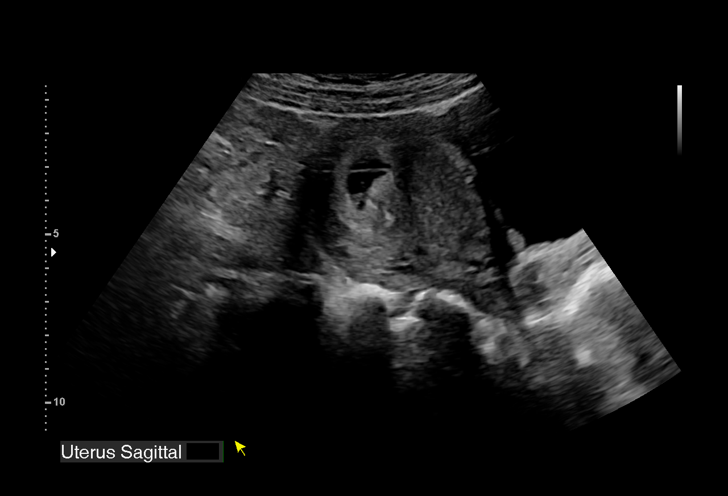
[im 10/65]
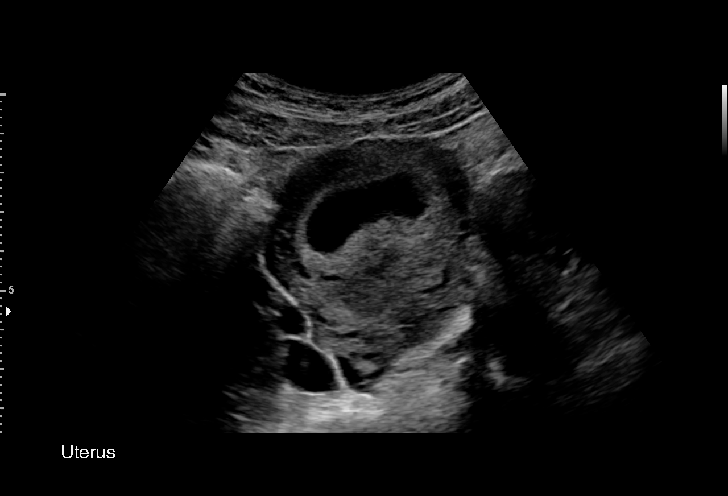
[im 15/65]
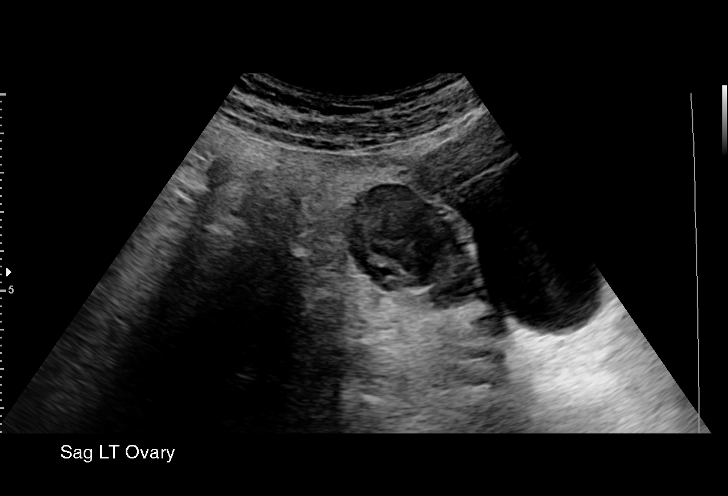
[im 19/65]
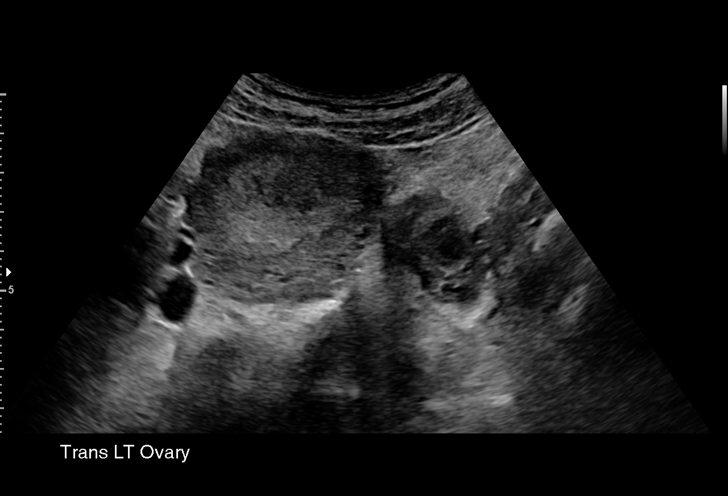
[im 24/65]
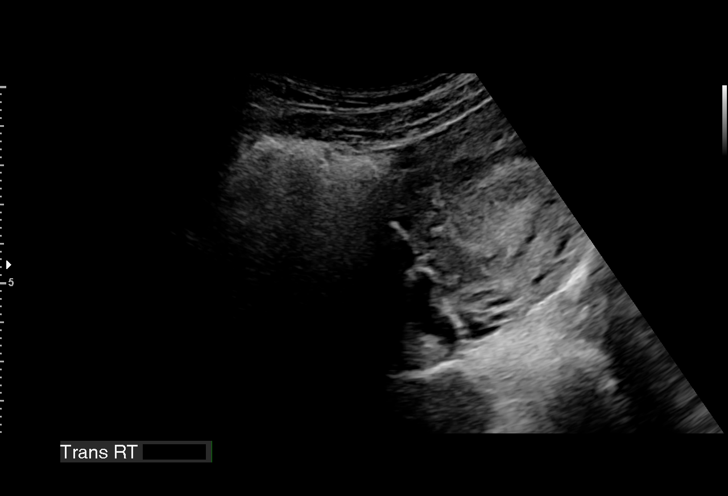
[im 29/65]
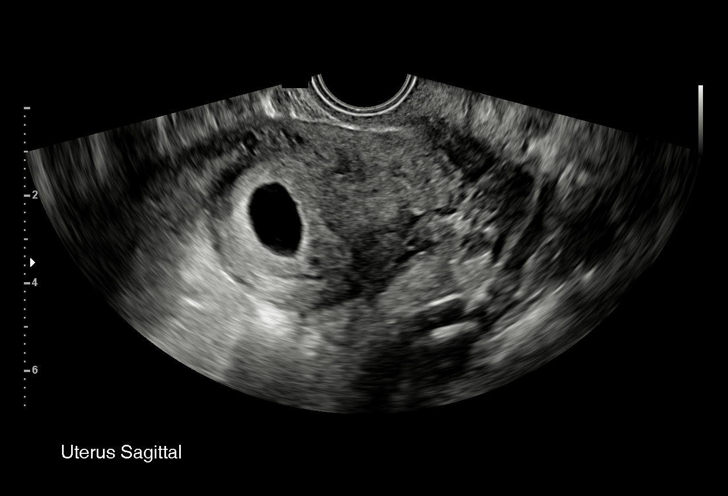
[im 34/65]
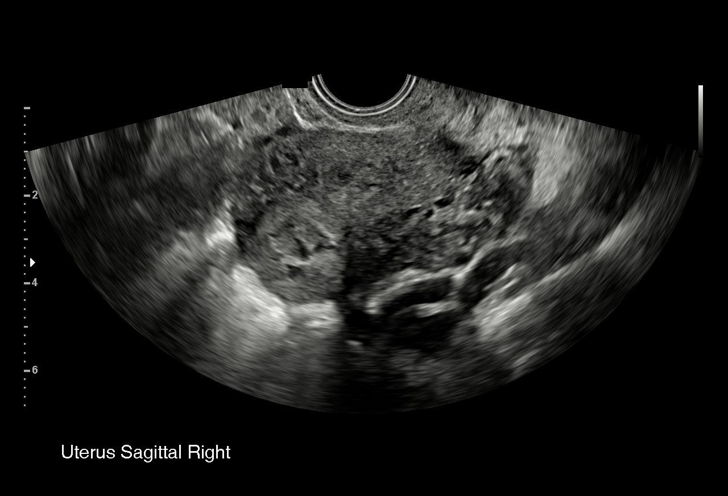
[im 36/65]
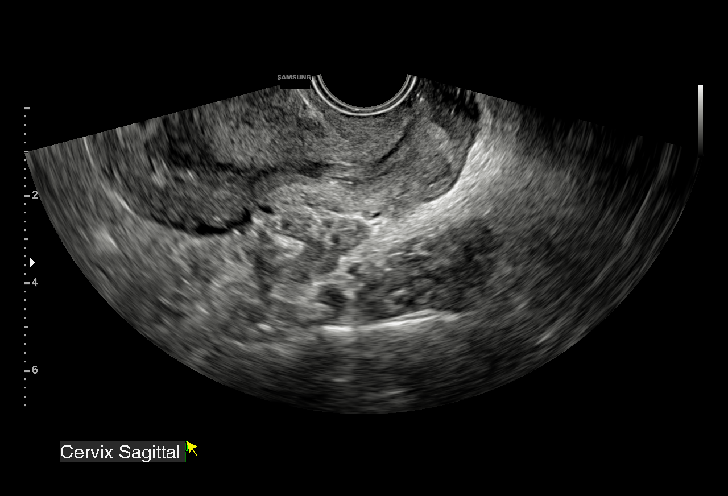
[im 41/65]
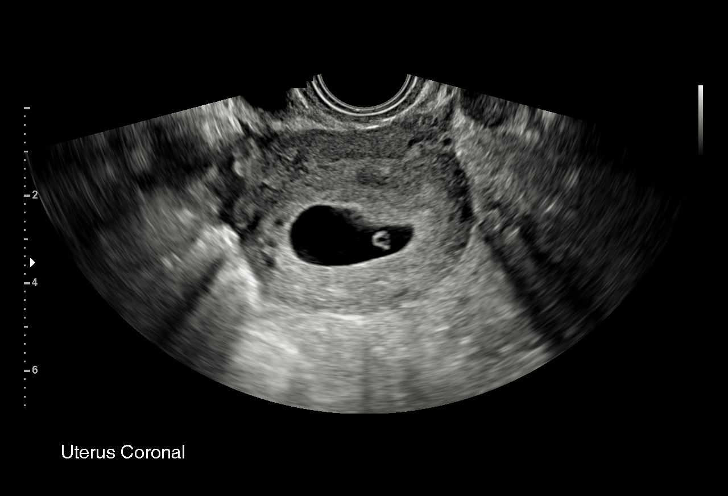
[im 46/65]
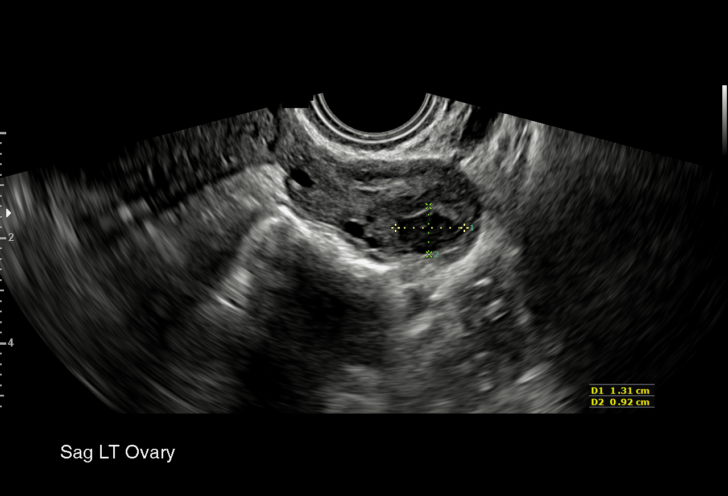
[im 50/65]
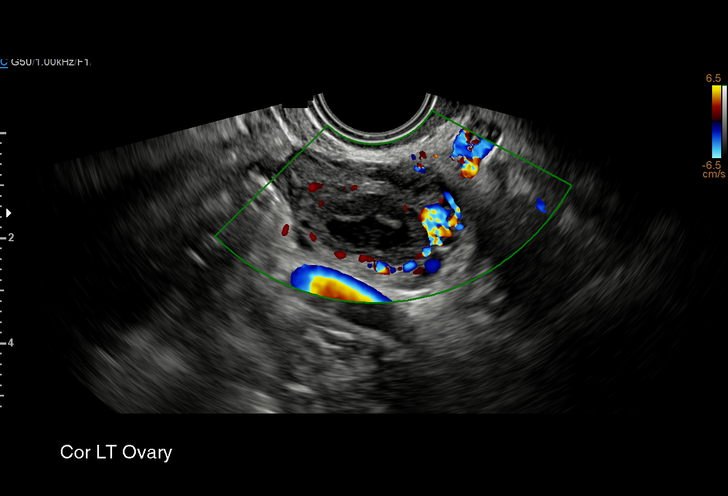
[im 55/65]
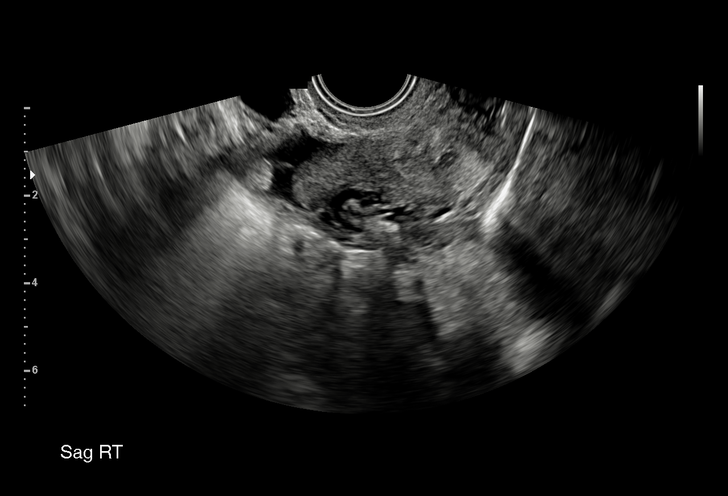
[im 60/65]
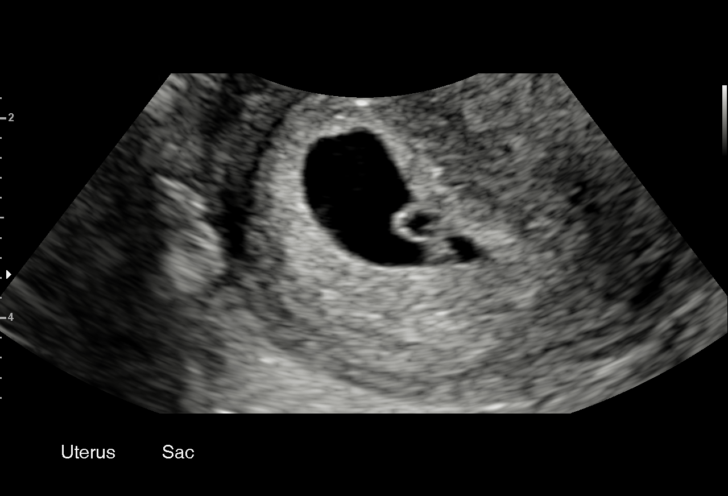
[im 65/65]
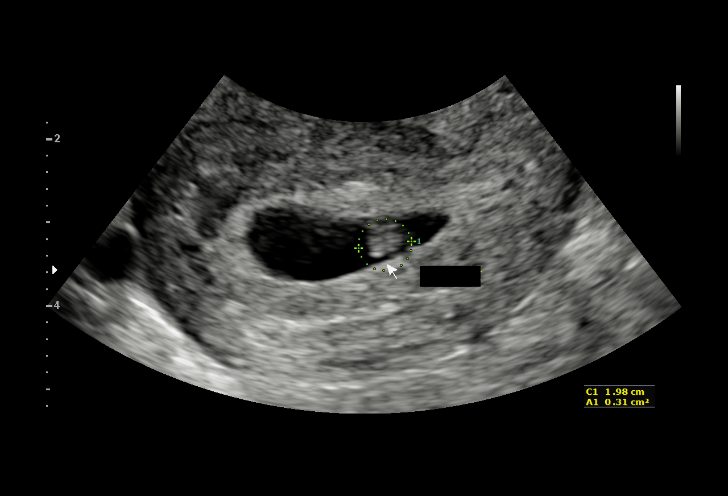

[15 of 28 positions shown; findings below may reference images not displayed]

FINDINGS: Intrauterine gestational sac: Present

Yolk sac:  Present

Embryo:  Present

Cardiac Activity: Present

Heart Rate: 127  bpm

CRL:  5.5  mm   6 w   2 d                  US EDC: 08/16/2017

Subchorionic hemorrhage:  None

Maternal uterus/adnexae: RIGHT ovary not identified. LEFT ovary with
corpus luteal cyst. Trace free fluid.
IMPRESSION: 1. Single intrauterine gestation with embryo and normal cardiac
activity.

2. Estimated gestational age by crown rump length equals 6 weeks 2
days.

## 2019-10-16 LAB — OB RESULTS CONSOLE GC/CHLAMYDIA
Chlamydia: NEGATIVE
Gonorrhea: NEGATIVE

## 2019-10-19 LAB — OB RESULTS CONSOLE GBS: GBS: NEGATIVE

## 2019-11-04 ENCOUNTER — Encounter (HOSPITAL_COMMUNITY): Payer: Self-pay | Admitting: *Deleted

## 2019-11-04 ENCOUNTER — Telehealth (HOSPITAL_COMMUNITY): Payer: Self-pay | Admitting: *Deleted

## 2019-11-04 NOTE — Telephone Encounter (Signed)
Preadmission screen  

## 2019-11-06 ENCOUNTER — Other Ambulatory Visit (HOSPITAL_COMMUNITY): Payer: Medicaid Other | Attending: Obstetrics and Gynecology

## 2019-11-08 ENCOUNTER — Inpatient Hospital Stay (HOSPITAL_COMMUNITY)
Admission: AD | Admit: 2019-11-08 | Payer: Medicaid Other | Source: Home / Self Care | Admitting: Obstetrics and Gynecology

## 2019-11-08 ENCOUNTER — Inpatient Hospital Stay (HOSPITAL_COMMUNITY): Payer: Medicaid Other

## 2019-11-09 ENCOUNTER — Encounter (HOSPITAL_COMMUNITY): Payer: Self-pay | Admitting: Obstetrics and Gynecology

## 2019-11-09 ENCOUNTER — Inpatient Hospital Stay (HOSPITAL_COMMUNITY)
Admission: AD | Admit: 2019-11-09 | Discharge: 2019-11-11 | DRG: 807 | Disposition: A | Discharge status: Home / Self Care | Payer: Medicaid Other | Attending: Obstetrics and Gynecology | Admitting: Obstetrics and Gynecology

## 2019-11-09 DIAGNOSIS — Z3A39 39 weeks gestation of pregnancy: Secondary | ICD-10-CM | POA: Diagnosis not present

## 2019-11-09 DIAGNOSIS — O26893 Other specified pregnancy related conditions, third trimester: Secondary | ICD-10-CM | POA: Diagnosis present

## 2019-11-09 DIAGNOSIS — Z20822 Contact with and (suspected) exposure to covid-19: Secondary | ICD-10-CM | POA: Diagnosis present

## 2019-11-09 LAB — CBC
HCT: 39.4 % (ref 36.0–46.0)
Hemoglobin: 12.9 g/dL (ref 12.0–15.0)
MCH: 28.2 pg (ref 26.0–34.0)
MCHC: 32.7 g/dL (ref 30.0–36.0)
MCV: 86 fL (ref 80.0–100.0)
Platelets: 118 10*3/uL — ABNORMAL LOW (ref 150–400)
RBC: 4.58 MIL/uL (ref 3.87–5.11)
RDW: 13.3 % (ref 11.5–15.5)
WBC: 9.8 10*3/uL (ref 4.0–10.5)
nRBC: 0 % (ref 0.0–0.2)

## 2019-11-09 LAB — OB RESULTS CONSOLE HEPATITIS B SURFACE ANTIGEN: Hepatitis B Surface Ag: NEGATIVE

## 2019-11-09 LAB — TYPE AND SCREEN
ABO/RH(D): O POS
Antibody Screen: NEGATIVE

## 2019-11-09 LAB — OB RESULTS CONSOLE RUBELLA ANTIBODY, IGM: Rubella: IMMUNE

## 2019-11-09 LAB — SARS CORONAVIRUS 2 BY RT PCR (HOSPITAL ORDER, PERFORMED IN ~~LOC~~ HOSPITAL LAB): SARS Coronavirus 2: NEGATIVE

## 2019-11-09 LAB — OB RESULTS CONSOLE RPR: RPR: NONREACTIVE

## 2019-11-09 LAB — RPR: RPR Ser Ql: NONREACTIVE

## 2019-11-09 LAB — OB RESULTS CONSOLE HIV ANTIBODY (ROUTINE TESTING): HIV: NONREACTIVE

## 2019-11-09 MED ORDER — PRENATAL MULTIVITAMIN CH
1.0000 | ORAL_TABLET | Freq: Every day | ORAL | Status: DC
  Administered 2019-11-09 – 2019-11-11 (×3): 1 via ORAL
  Filled 2019-11-09 (×3): qty 1

## 2019-11-09 MED ORDER — DIBUCAINE (PERIANAL) 1 % EX OINT: 1.0000 "application " | TOPICAL_OINTMENT | CUTANEOUS | Status: DC | PRN

## 2019-11-09 MED ORDER — WITCH HAZEL-GLYCERIN EX PADS: 1.0000 "application " | MEDICATED_PAD | CUTANEOUS | Status: DC | PRN

## 2019-11-09 MED ORDER — ZOLPIDEM TARTRATE 5 MG PO TABS: 5.0000 mg | ORAL_TABLET | Freq: Every evening | ORAL | Status: DC | PRN

## 2019-11-09 MED ORDER — LACTATED RINGERS IV SOLN: 500.0000 mL | INTRAVENOUS | Status: DC | PRN

## 2019-11-09 MED ORDER — ONDANSETRON HCL 4 MG/2ML IJ SOLN: 4.0000 mg | Freq: Four times a day (QID) | INTRAMUSCULAR | Status: DC | PRN

## 2019-11-09 MED ORDER — ACETAMINOPHEN 325 MG PO TABS
650.0000 mg | ORAL_TABLET | ORAL | Status: DC | PRN
  Administered 2019-11-09: 650 mg via ORAL
  Filled 2019-11-09: qty 2

## 2019-11-09 MED ORDER — IBUPROFEN 600 MG PO TABS
600.0000 mg | ORAL_TABLET | Freq: Four times a day (QID) | ORAL | Status: DC
  Administered 2019-11-09 – 2019-11-11 (×8): 600 mg via ORAL
  Filled 2019-11-09 (×9): qty 1

## 2019-11-09 MED ORDER — OXYCODONE-ACETAMINOPHEN 5-325 MG PO TABS: 2.0000 | ORAL_TABLET | ORAL | Status: DC | PRN

## 2019-11-09 MED ORDER — BENZOCAINE-MENTHOL 20-0.5 % EX AERO: 1.0000 "application " | INHALATION_SPRAY | CUTANEOUS | Status: DC | PRN

## 2019-11-09 MED ORDER — LIDOCAINE HCL (PF) 1 % IJ SOLN: 30.0000 mL | INTRAMUSCULAR | Status: DC | PRN

## 2019-11-09 MED ORDER — OXYTOCIN-SODIUM CHLORIDE 30-0.9 UT/500ML-% IV SOLN
INTRAVENOUS | Status: AC
  Filled 2019-11-09: qty 500

## 2019-11-09 MED ORDER — FLEET ENEMA 7-19 GM/118ML RE ENEM: 1.0000 | ENEMA | Freq: Every day | RECTAL | Status: DC | PRN

## 2019-11-09 MED ORDER — SIMETHICONE 80 MG PO CHEW: 80.0000 mg | CHEWABLE_TABLET | ORAL | Status: DC | PRN

## 2019-11-09 MED ORDER — ONDANSETRON HCL 4 MG PO TABS: 4.0000 mg | ORAL_TABLET | ORAL | Status: DC | PRN

## 2019-11-09 MED ORDER — LACTATED RINGERS IV SOLN: INTRAVENOUS | Status: DC

## 2019-11-09 MED ORDER — OXYTOCIN BOLUS FROM INFUSION
333.0000 mL | Freq: Once | INTRAVENOUS | Status: AC
  Administered 2019-11-09: 333 mL via INTRAVENOUS

## 2019-11-09 MED ORDER — SENNOSIDES-DOCUSATE SODIUM 8.6-50 MG PO TABS
2.0000 | ORAL_TABLET | ORAL | Status: DC
  Administered 2019-11-09 – 2019-11-10 (×2): 2 via ORAL
  Filled 2019-11-09 (×2): qty 2

## 2019-11-09 MED ORDER — COCONUT OIL OIL: 1.0000 "application " | TOPICAL_OIL | Status: DC | PRN

## 2019-11-09 MED ORDER — DIPHENHYDRAMINE HCL 25 MG PO CAPS: 25.0000 mg | ORAL_CAPSULE | Freq: Four times a day (QID) | ORAL | Status: DC | PRN

## 2019-11-09 MED ORDER — ACETAMINOPHEN 325 MG PO TABS: 650.0000 mg | ORAL_TABLET | Freq: Four times a day (QID) | ORAL | Status: DC | PRN

## 2019-11-09 MED ORDER — SOD CITRATE-CITRIC ACID 500-334 MG/5ML PO SOLN: 30.0000 mL | ORAL | Status: DC | PRN

## 2019-11-09 MED ORDER — ACETAMINOPHEN 325 MG PO TABS: 650.0000 mg | ORAL_TABLET | ORAL | Status: DC | PRN

## 2019-11-09 MED ORDER — OXYCODONE-ACETAMINOPHEN 5-325 MG PO TABS: 1.0000 | ORAL_TABLET | ORAL | Status: DC | PRN

## 2019-11-09 MED ORDER — ONDANSETRON HCL 4 MG/2ML IJ SOLN: 4.0000 mg | INTRAMUSCULAR | Status: DC | PRN

## 2019-11-09 MED ORDER — OXYTOCIN-SODIUM CHLORIDE 30-0.9 UT/500ML-% IV SOLN: 2.5000 [IU]/h | INTRAVENOUS | Status: DC

## 2019-11-09 NOTE — H&P (Signed)
Erika Kelly is a 24 y.o. female G2P1 @ 39+wk for active labor.  Contractions starting around 5am this am with spontaneous rupture while in MAU.  No vaginal bleeding. +FM.  Pt reports uncomplicated pregnancy  Patient Active Problem List   Diagnosis Date Noted  . Normal labor 08/06/2017  . Abnormal glucose tolerance test (GTT) during pregnancy, antepartum 05/29/2017    History of present pregnancy: PNC with Dr. Richardson Dopp Per pt pregnancy uncomplicated  OB History    Gravida  2   Para  1   Term  1   Preterm      AB      Living  1     SAB      TAB      Ectopic      Multiple      Live Births  1          Past Medical History:  Diagnosis Date  . Gestational diabetes   . Medical history non-contributory    Past Surgical History:  Procedure Laterality Date  . NO PAST SURGERIES     Family History: family history includes Diabetes in her brother and paternal grandfather; Heart disease in her maternal grandfather and paternal grandfather; Hypertension in her maternal grandfather, maternal grandmother, and paternal grandfather. Social History:  reports that she has never smoked. She has never used smokeless tobacco. She reports that she does not drink alcohol and does not use drugs.   Prenatal Transfer Tool  Maternal Diabetes: No Genetic Screening: Normal Maternal Ultrasounds/Referrals: Normal Fetal Ultrasounds or other Referrals:  None Maternal Substance Abuse:  No Significant Maternal Medications:  None Significant Maternal Lab Results: Group B Strep negative  ROS:  +contractions  No Known Allergies   Dilation: 8.5 Effacement (%): 100 Station: 0 Exam by:: Judeth Horn, NP unknown if currently breastfeeding.  FHR: Category 140 UCs:  q2-68min  Prenatal labs: ABO, Rh:  O pos Antibody:  negative Rubella:   immune RPR:   neg HBsAg:   neg HIV:   neg GBS:  neg   Assessment/Plan: Upon arrival- pt feeling painful contractions and urge to push -IV  started -see delivery note  Myna Hidalgo, DO [phone] (cell) [phone] (office)

## 2019-11-10 LAB — CBC
HCT: 32 % — ABNORMAL LOW (ref 36.0–46.0)
Hemoglobin: 10.5 g/dL — ABNORMAL LOW (ref 12.0–15.0)
MCH: 27.8 pg (ref 26.0–34.0)
MCHC: 32.8 g/dL (ref 30.0–36.0)
MCV: 84.7 fL (ref 80.0–100.0)
Platelets: 109 10*3/uL — ABNORMAL LOW (ref 150–400)
RBC: 3.78 MIL/uL — ABNORMAL LOW (ref 3.87–5.11)
RDW: 13.5 % (ref 11.5–15.5)
WBC: 9.2 10*3/uL (ref 4.0–10.5)
nRBC: 0 % (ref 0.0–0.2)

## 2019-11-10 NOTE — Progress Notes (Signed)
Post Partum Day 1 Subjective: no complaints, up ad lib, voiding and tolerating PO  Objective: Blood pressure (!) 109/59, pulse 74, temperature 98.2 F (36.8 C), temperature source Oral, resp. rate 18, height 5\' 3"  (1.6 m), SpO2 100 %, unknown if currently breastfeeding.  Physical Exam:  General: alert, cooperative and no distress Lochia: appropriate Uterine Fundus: firm Incision: NA DVT Evaluation: No evidence of DVT seen on physical exam.  Recent Labs    11/09/19 0630 11/10/19 0509  HGB 12.9 10.5*  HCT 39.4 32.0*    Assessment/Plan: Plan for discharge tomorrow and Breastfeeding  Routine postpartum care    LOS: 1 day   11/12/19 11/10/2019, 5:17 PM

## 2019-11-11 MED ORDER — IBUPROFEN 600 MG PO TABS
600.0000 mg | ORAL_TABLET | Freq: Four times a day (QID) | ORAL | 0 refills | Status: DC | PRN
Start: 2019-11-11 — End: 2021-07-18

## 2019-11-11 NOTE — Discharge Summary (Signed)
Postpartum Discharge Summary  Date of Service updated 11/11/2019     Patient Name: Erika Kelly DOB: 03-31-1995 MRN: [id number]  Date of admission: 11/09/2019 Delivery date:11/09/2019  Delivering provider: Janyth Pupa  Date of discharge: 11/11/2019  Admitting diagnosis: Normal labor [O80, Z37.9] Intrauterine pregnancy: [redacted]w[redacted]d    Secondary diagnosis:  Active Problems:   Normal labor  Additional problems: None    Discharge diagnosis: Term Pregnancy Delivered                                              Post partum procedures:None Augmentation: N/A Complications: None  Hospital course: Onset of Labor With Vaginal Delivery      24y.o. yo [id number] 343w5das admitted in Active Labor on 11/09/2019. Patient had an uncomplicated labor course as follows:  Membrane Rupture Time/Date: 6:23 AM ,11/09/2019   Delivery Method:Vaginal, Spontaneous  Episiotomy: None  Lacerations:  None  Patient had an uncomplicated postpartum course.  She is ambulating, tolerating a regular diet, passing flatus, and urinating well. Patient is discharged home in stable condition on 11/11/19.  Newborn Data: Birth date:11/09/2019  Birth time:6:37 AM  Gender:Female  Living status:Living  Apgars:9 ,9  Weight:2615 g   Magnesium Sulfate received: No BMZ received: No Rhophylac:N/A MMR:No T-DaP:Given prenatally Flu: No Transfusion:No  Physical exam  Vitals:   11/10/19 0434 11/10/19 1430 11/10/19 1900 11/11/19 0536  BP: (!) 109/57 (!) 109/59 123/75 (!) 105/59  Pulse: 70 74 79 65  Resp: _0 Temp: 98.1 F (36.7 C) 98.2 F (36.8 C) 98.5 F (36.9 C) 98.3 F (36.8 C)  TempSrc: Oral Oral Oral Oral  SpO2: 100%  100%   Height:       General: alert, cooperative and no distress Lochia: appropriate Uterine Fundus: firm Incision: N/A DVT Evaluation: No evidence of DVT seen on physical exam. Labs: Lab Results  Component Value Date   WBC 9.2 11/10/2019   HGB 10.5 (L) 11/10/2019   HCT  32.0 (L) 11/10/2019   MCV 84.7 11/10/2019   PLT 109 (L) 11/10/2019   No flowsheet data found. Edinburgh Score: Edinburgh Postnatal Depression Scale Screening Tool 11/10/2019  I have been able to laugh and see the funny side of things. 0  I have looked forward with enjoyment to things. 0  I have blamed myself unnecessarily when things went wrong. 0  I have been anxious or worried for no good reason. 0  I have felt scared or panicky for no good reason. 0  Things have been getting on top of me. 0  I have been so unhappy that I have had difficulty sleeping. 0  I have felt sad or miserable. 0  I have been so unhappy that I have been crying. 0  The thought of harming myself has occurred to me. 0  Edinburgh Postnatal Depression Scale Total 0      After visit meds:  Allergies as of 11/11/2019   No Known Allergies     Medication List    TAKE these medications   ibuprofen 600 MG tablet Commonly known as: ADVIL Take 1 tablet (600 mg total) by mouth every 6 (six) hours as needed. What changed:   when to take this  reasons to take this   prenatal multivitamin Tabs tablet Take 1 tablet by mouth at bedtime.  Discharge home in stable condition Infant Feeding: Breast Infant Disposition:home with mother Discharge instruction: per After Visit Summary and Postpartum booklet. Activity: Advance as tolerated. Pelvic rest for 6 weeks.  Diet: routine diet Anticipated Birth Control: Condoms Postpartum Appointment:6 weeks Additional Postpartum F/U: NA Future Appointments:No future appointments. Follow up Visit:  Follow-up Information    Christophe Louis, MD. Schedule an appointment as soon as possible for a visit in 6 week(s).   Specialty: Obstetrics and Gynecology Why: please call to schedule a 6 week postpartum visit with Dr. Landry Mellow if you do not already have one scheduled  Contact information: [address] [place] [address] [phone]                    11/11/2019 Christophe Louis, MD

## 2020-10-09 LAB — HM PAP SMEAR
HM Pap smear: NEGATIVE
HM Pap smear: NEGATIVE

## 2021-07-12 ENCOUNTER — Ambulatory Visit: Payer: 59 | Admitting: Family Medicine

## 2021-07-18 ENCOUNTER — Encounter: Payer: Self-pay | Admitting: Family

## 2021-07-18 ENCOUNTER — Ambulatory Visit (INDEPENDENT_AMBULATORY_CARE_PROVIDER_SITE_OTHER): Payer: 59 | Admitting: Family

## 2021-07-18 ENCOUNTER — Telehealth: Payer: Self-pay | Admitting: Family

## 2021-07-18 VITALS — BP 110/74 | HR 73 | Temp 98.6°F | Resp 16 | Ht 63.0 in | Wt 111.6 lb

## 2021-07-18 DIAGNOSIS — D509 Iron deficiency anemia, unspecified: Secondary | ICD-10-CM | POA: Diagnosis not present

## 2021-07-18 DIAGNOSIS — Z0000 Encounter for general adult medical examination without abnormal findings: Secondary | ICD-10-CM | POA: Insufficient documentation

## 2021-07-18 DIAGNOSIS — E538 Deficiency of other specified B group vitamins: Secondary | ICD-10-CM

## 2021-07-18 DIAGNOSIS — Z1159 Encounter for screening for other viral diseases: Secondary | ICD-10-CM

## 2021-07-18 DIAGNOSIS — R17 Unspecified jaundice: Secondary | ICD-10-CM

## 2021-07-18 LAB — CBC WITH DIFFERENTIAL/PLATELET
Basophils Absolute: 0 10*3/uL (ref 0.0–0.1)
Basophils Relative: 1 % (ref 0.0–3.0)
Eosinophils Absolute: 0.1 10*3/uL (ref 0.0–0.7)
Eosinophils Relative: 2.3 % (ref 0.0–5.0)
HCT: 39.3 % (ref 36.0–46.0)
Hemoglobin: 12.8 g/dL (ref 12.0–15.0)
Lymphocytes Relative: 36.6 % (ref 12.0–46.0)
Lymphs Abs: 1.6 10*3/uL (ref 0.7–4.0)
MCHC: 32.5 g/dL (ref 30.0–36.0)
MCV: 82 fl (ref 78.0–100.0)
Monocytes Absolute: 0.4 10*3/uL (ref 0.1–1.0)
Monocytes Relative: 9.4 % (ref 3.0–12.0)
Neutro Abs: 2.3 10*3/uL (ref 1.4–7.7)
Neutrophils Relative %: 50.7 % (ref 43.0–77.0)
Platelets: 118 10*3/uL — ABNORMAL LOW (ref 150.0–400.0)
RBC: 4.79 Mil/uL (ref 3.87–5.11)
RDW: 12.4 % (ref 11.5–15.5)
WBC: 4.5 10*3/uL (ref 4.0–10.5)

## 2021-07-18 LAB — VITAMIN B12: Vitamin B-12: 505 pg/mL (ref 211–911)

## 2021-07-18 LAB — FERRITIN: Ferritin: 5.8 ng/mL — ABNORMAL LOW (ref 10.0–291.0)

## 2021-07-18 LAB — IRON: Iron: 68 ug/dL (ref 42–145)

## 2021-07-18 NOTE — Telephone Encounter (Signed)
Please request copy of pap from Dr. Gerald Leitz.

## 2021-07-18 NOTE — Progress Notes (Signed)
Subjective:     Patient ID: Erika Kelly, female    DOB: [birth year], 26 y.o.   MRN: [id number]  Chief Complaint  Patient presents with   Establish Care    Establish Care      HPI Patient is in today to establish care. She was previously seen at [facility].  Vit D deficiency- uses 2000 iu of vit D once daily some days.  Ferritin- She takes a multvitamin with minerals but notes that she has had low ferritin levels and mild low platelet counts (chronically), in the past.  Sees Dr. Gerald Leitz for GYN pap 10/09/20  Immunizations: up to date Diet: healthy- cooks at home Exercise: no formal, some walking Pap Smear: up to date Dental: up to date Vision: up to date  Health Maintenance Due  Topic Date Due   Hepatitis C Screening  Never done   PAP-Cervical Cytology Screening  02/15/2020   PAP SMEAR-Modifier  02/15/2020   COVID-19 Vaccine (3 - Pfizer risk series) 03/03/2020    Past Medical History:  Diagnosis Date   Anemia    Gestational diabetes    Medical history non-contributory     Past Surgical History:  Procedure Laterality Date   NO PAST SURGERIES      Family History  Problem Relation Age of Onset   Hypertension Mother    Hypertension Father    Polycystic ovary syndrome Sister    Diabetes Brother        type 1   Hypertension Maternal Grandmother    Heart disease Maternal Grandfather    Hypertension Maternal Grandfather    Diabetes Paternal Grandfather    Hypertension Paternal Grandfather    Heart disease Paternal Grandfather     Social History   Socioeconomic History   Marital status: Married    Spouse name: Not on file   Number of children: Not on file   Years of education: Not on file   Highest education level: Not on file  Occupational History   Not on file  Tobacco Use   Smoking status: Never   Smokeless tobacco: Never  Substance and Sexual Activity   Alcohol use: No   Drug use: No   Sexual activity: Yes    Birth control/protection: None   Other Topics Concern   Not on file  Social History Narrative   Son born 2019   Daughter born 2021   Stay at home mom   Married   Completed associates in medical assisting   No pets   Enjoys baking   Moved in 2016 with her husband from [place]   Her family is all in [place], husband's family is local   Social Determinants of Health   Financial Resource Strain: Not on file  Food Insecurity: Not on file  Transportation Needs: Not on file  Physical Activity: Not on file  Stress: Not on file  Social Connections: Not on file  Intimate Partner Violence: Not on file    Outpatient Medications Prior to Visit  Medication Sig Dispense Refill   ferrous sulfate 325 (65 FE) MG EC tablet Take 325 mg by mouth 3 (three) times daily with meals.     Prenatal Vit-Fe Fumarate-FA (PRENATAL MULTIVITAMIN) TABS tablet Take 1 tablet by mouth at bedtime.     ibuprofen (ADVIL) 600 MG tablet Take 1 tablet (600 mg total) by mouth every 6 (six) hours as needed. 30 tablet 0   No facility-administered medications prior to visit.    No Known Allergies  Review  of Systems  Constitutional:  Negative for weight loss.  HENT:  Negative for congestion and hearing loss.   Eyes:  Negative for blurred vision.  Respiratory:  Negative for cough and shortness of breath.   Cardiovascular:  Negative for leg swelling.  Gastrointestinal:  Negative for constipation and diarrhea.  Genitourinary:  Negative for dysuria and frequency.  Musculoskeletal:  Negative for joint pain and myalgias.  Skin:  Negative for rash.  Neurological:  Negative for headaches.  Psychiatric/Behavioral:         Denies depression/anxiety      Objective:    Physical Exam Constitutional:      General: She is not in acute distress.    Appearance: Normal appearance. She is well-developed.  HENT:     Head: Normocephalic and atraumatic.     Right Ear: External ear normal.     Left Ear: External ear normal.  Eyes:     General: No scleral  icterus. Neck:     Thyroid: No thyromegaly.  Cardiovascular:     Rate and Rhythm: Normal rate and regular rhythm.     Heart sounds: Normal heart sounds. No murmur heard. Pulmonary:     Effort: Pulmonary effort is normal. No respiratory distress.     Breath sounds: Normal breath sounds. No wheezing.  Musculoskeletal:     Cervical back: Neck supple.  Skin:    General: Skin is warm and dry.  Neurological:     Mental Status: She is alert and oriented to person, place, and time.  Psychiatric:        Mood and Affect: Mood normal.        Behavior: Behavior normal.        Thought Content: Thought content normal.        Judgment: Judgment normal.    BP 110/74 (BP Location: Left Arm, Patient Position: Sitting, Cuff Size: Normal)   Pulse 73   Temp 98.6 F (37 C) (Oral)   Resp 16   Ht 5\' 3"  (1.6 m)   Wt 111 lb 9.6 oz (50.6 kg)   SpO2 100%   BMI 19.77 kg/m  Wt Readings from Last 3 Encounters:  07/18/21 111 lb 9.6 oz (50.6 kg)  08/31/17 119 lb (54 kg)  08/06/17 130 lb (59 kg)       Assessment & Plan:   Problem List Items Addressed This Visit       Unprioritized   Preventative health care    Encouraged pt to continue healthy diet, and try to add 30 minutes of walking 5 day a week. Pap smear is up to date. She will check with GYN if she had gardasil series.        Other Visit Diagnoses     Need for hepatitis C screening test    -  Primary   Relevant Orders   Hepatitis C Antibody   Iron deficiency anemia, unspecified iron deficiency anemia type       Relevant Medications   ferrous sulfate 325 (65 FE) MG EC tablet   Other Relevant Orders   CBC with Differential/Platelet   Iron   Ferritin   Low vitamin B12 level       Relevant Orders   B12       I have discontinued Erika Kelly's ibuprofen. I am also having her maintain her prenatal multivitamin and ferrous sulfate.  No orders of the defined types were placed in this encounter.

## 2021-07-18 NOTE — Assessment & Plan Note (Signed)
Encouraged pt to continue healthy diet, and try to add 30 minutes of walking 5 day a week. Pap smear is up to date. She will check with GYN if she had gardasil series.

## 2021-07-18 NOTE — Telephone Encounter (Signed)
Request will be faxed to provider

## 2021-07-18 NOTE — Patient Instructions (Addendum)
Please schedule a routine eye exam.  Please complete lab work prior to leaving.  

## 2021-07-19 LAB — HEPATITIS C ANTIBODY
Hepatitis C Ab: NONREACTIVE
SIGNAL TO CUT-OFF: 0.07 (ref <1.00)

## 2021-07-20 ENCOUNTER — Other Ambulatory Visit (INDEPENDENT_AMBULATORY_CARE_PROVIDER_SITE_OTHER): Payer: 59

## 2021-07-20 ENCOUNTER — Telehealth: Payer: Self-pay | Admitting: Family

## 2021-07-20 DIAGNOSIS — R17 Unspecified jaundice: Secondary | ICD-10-CM

## 2021-07-20 NOTE — Telephone Encounter (Signed)
See mychart.  

## 2021-07-21 LAB — COMPREHENSIVE METABOLIC PANEL
AG Ratio: 1.4 (calc) (ref 1.0–2.5)
ALT: 17 U/L (ref 6–29)
AST: 17 U/L (ref 10–30)
Albumin: 4.2 g/dL (ref 3.6–5.1)
Alkaline phosphatase (APISO): 42 U/L (ref 31–125)
BUN: 13 mg/dL (ref 7–25)
CO2: 24 mmol/L (ref 20–32)
Calcium: 9.4 mg/dL (ref 8.6–10.2)
Chloride: 105 mmol/L (ref 98–110)
Creat: 0.62 mg/dL (ref 0.50–0.96)
Globulin: 2.9 g/dL (calc) (ref 1.9–3.7)
Glucose, Bld: 83 mg/dL (ref 65–99)
Potassium: 3.8 mmol/L (ref 3.5–5.3)
Sodium: 137 mmol/L (ref 135–146)
Total Bilirubin: 0.6 mg/dL (ref 0.2–1.2)
Total Protein: 7.1 g/dL (ref 6.1–8.1)

## 2021-07-23 ENCOUNTER — Other Ambulatory Visit: Payer: Self-pay | Admitting: Family

## 2021-07-23 DIAGNOSIS — R790 Abnormal level of blood mineral: Secondary | ICD-10-CM

## 2021-07-23 MED ORDER — FERROUS SULFATE 325 (65 FE) MG PO TBEC: 325.0000 mg | DELAYED_RELEASE_TABLET | ORAL | Status: DC

## 2021-11-05 ENCOUNTER — Encounter: Payer: Self-pay | Admitting: Family

## 2021-11-05 DIAGNOSIS — E559 Vitamin D deficiency, unspecified: Secondary | ICD-10-CM

## 2021-11-06 ENCOUNTER — Other Ambulatory Visit (INDEPENDENT_AMBULATORY_CARE_PROVIDER_SITE_OTHER): Payer: Commercial Managed Care - HMO

## 2021-11-06 ENCOUNTER — Telehealth: Payer: Self-pay | Admitting: Family

## 2021-11-06 DIAGNOSIS — R790 Abnormal level of blood mineral: Secondary | ICD-10-CM | POA: Diagnosis not present

## 2021-11-06 DIAGNOSIS — E559 Vitamin D deficiency, unspecified: Secondary | ICD-10-CM | POA: Diagnosis not present

## 2021-11-06 LAB — CBC WITH DIFFERENTIAL/PLATELET
Basophils Absolute: 0 10*3/uL (ref 0.0–0.1)
Basophils Relative: 0.4 % (ref 0.0–3.0)
Eosinophils Absolute: 0.1 10*3/uL (ref 0.0–0.7)
Eosinophils Relative: 1.9 % (ref 0.0–5.0)
HCT: 38.5 % (ref 36.0–46.0)
Hemoglobin: 12.9 g/dL (ref 12.0–15.0)
Lymphocytes Relative: 26 % (ref 12.0–46.0)
Lymphs Abs: 1.4 10*3/uL (ref 0.7–4.0)
MCHC: 33.5 g/dL (ref 30.0–36.0)
MCV: 82.3 fl (ref 78.0–100.0)
Monocytes Absolute: 0.5 10*3/uL (ref 0.1–1.0)
Monocytes Relative: 9.4 % (ref 3.0–12.0)
Neutro Abs: 3.2 10*3/uL (ref 1.4–7.7)
Neutrophils Relative %: 62.3 % (ref 43.0–77.0)
Platelets: 122 10*3/uL — ABNORMAL LOW (ref 150.0–400.0)
RBC: 4.68 Mil/uL (ref 3.87–5.11)
RDW: 12 % (ref 11.5–15.5)
WBC: 5.2 10*3/uL (ref 4.0–10.5)

## 2021-11-06 LAB — FERRITIN: Ferritin: 31.9 ng/mL (ref 10.0–291.0)

## 2021-11-06 LAB — IRON: Iron: 94 ug/dL (ref 42–145)

## 2021-11-06 LAB — VITAMIN D 25 HYDROXY (VIT D DEFICIENCY, FRACTURES): VITD: 18.34 ng/mL — ABNORMAL LOW (ref 30.00–100.00)

## 2021-11-06 MED ORDER — VITAMIN D (ERGOCALCIFEROL) 1.25 MG (50000 UNIT) PO CAPS: 50000.0000 [IU] | ORAL_CAPSULE | ORAL | 0 refills | Status: DC

## 2021-11-06 NOTE — Telephone Encounter (Signed)
Please advise pt that platelets are a little low but normal for her.    Vitamin D level is low.  Advise patient to begin vit D 50000 units once weekly for 12 weeks, then repeat vit D level (dx Vit D deficiency).

## 2021-11-07 NOTE — Telephone Encounter (Signed)
Called but no answer, lvm for patient to call back 

## 2021-11-08 ENCOUNTER — Other Ambulatory Visit: Payer: Self-pay

## 2021-11-08 DIAGNOSIS — E559 Vitamin D deficiency, unspecified: Secondary | ICD-10-CM

## 2021-11-08 MED ORDER — VITAMIN D (ERGOCALCIFEROL) 1.25 MG (50000 UNIT) PO CAPS: 50000.0000 [IU] | ORAL_CAPSULE | ORAL | 0 refills | Status: AC

## 2021-11-08 NOTE — Telephone Encounter (Signed)
Patient advised of results and new rx. She will call back to set up lab appt

## 2022-01-19 ENCOUNTER — Encounter: Payer: Self-pay | Admitting: Family

## 2022-01-19 DIAGNOSIS — Z0000 Encounter for general adult medical examination without abnormal findings: Secondary | ICD-10-CM

## 2022-02-01 LAB — OB RESULTS CONSOLE HIV ANTIBODY (ROUTINE TESTING): HIV: NONREACTIVE

## 2022-02-01 LAB — OB RESULTS CONSOLE RUBELLA ANTIBODY, IGM: Rubella: IMMUNE

## 2022-02-01 LAB — OB RESULTS CONSOLE HEPATITIS B SURFACE ANTIGEN: Hepatitis B Surface Ag: NEGATIVE

## 2022-02-25 NOTE — L&D Delivery Note (Signed)
Delivery Note At 5:45 PM a viable female was delivered via Vaginal, Spontaneous (Presentation: Left Occiput Anterior).  APGAR: 9, 9; weight  .   Placenta status: Spontaneous, Intact.  Cord: 3 vessels with the following complications: None.  Cord pH: NA  Anesthesia: Epidural Episiotomy: None Lacerations: None Suture Repair:  NA Est. Blood Loss (mL):  85 cc   Mom to postpartum.  Baby to Couplet care / Skin to Skin.  Gerald Leitz 09/16/2022, 6:16 PM

## 2022-09-16 ENCOUNTER — Inpatient Hospital Stay (HOSPITAL_COMMUNITY)
Admission: AD | Admit: 2022-09-16 | Discharge: 2022-09-18 | DRG: 807 | Disposition: A | Discharge status: Home / Self Care | Payer: Medicaid Other | Attending: Obstetrics and Gynecology | Admitting: Obstetrics and Gynecology

## 2022-09-16 ENCOUNTER — Encounter (HOSPITAL_COMMUNITY): Payer: Self-pay | Admitting: *Deleted

## 2022-09-16 ENCOUNTER — Other Ambulatory Visit: Payer: Self-pay

## 2022-09-16 ENCOUNTER — Inpatient Hospital Stay (HOSPITAL_COMMUNITY): Payer: Medicaid Other | Admitting: Anesthesiology

## 2022-09-16 DIAGNOSIS — O26893 Other specified pregnancy related conditions, third trimester: Secondary | ICD-10-CM | POA: Diagnosis present

## 2022-09-16 DIAGNOSIS — Z3A40 40 weeks gestation of pregnancy: Secondary | ICD-10-CM | POA: Diagnosis not present

## 2022-09-16 LAB — CBC
HCT: 39.8 % (ref 36.0–46.0)
HCT: 38.5 % (ref 36.0–46.0)
Hemoglobin: 13.6 g/dL (ref 12.0–15.0)
Hemoglobin: 12.7 g/dL (ref 12.0–15.0)
MCH: 29.1 pg (ref 26.0–34.0)
MCH: 28.6 pg (ref 26.0–34.0)
MCHC: 34.2 g/dL (ref 30.0–36.0)
MCHC: 33 g/dL (ref 30.0–36.0)
MCV: 85.2 fL (ref 80.0–100.0)
MCV: 86.7 fL (ref 80.0–100.0)
Platelets: 98 10*3/uL — ABNORMAL LOW (ref 150–400)
Platelets: 102 10*3/uL — ABNORMAL LOW (ref 150–400)
RBC: 4.67 MIL/uL (ref 3.87–5.11)
RBC: 4.44 MIL/uL (ref 3.87–5.11)
RDW: 15 % (ref 11.5–15.5)
RDW: 14.7 % (ref 11.5–15.5)
WBC: 5.5 10*3/uL (ref 4.0–10.5)
WBC: 8.6 10*3/uL (ref 4.0–10.5)
nRBC: 0 % (ref 0.0–0.2)
nRBC: 0 % (ref 0.0–0.2)

## 2022-09-16 LAB — TYPE AND SCREEN
ABO/RH(D): O POS
Antibody Screen: NEGATIVE

## 2022-09-16 MED ORDER — FENTANYL CITRATE (PF) 100 MCG/2ML IJ SOLN
50.0000 ug | INTRAMUSCULAR | Status: DC | PRN
  Administered 2022-09-16: 50 ug via INTRAVENOUS
  Filled 2022-09-16: qty 2

## 2022-09-16 MED ORDER — OXYTOCIN 10 UNIT/ML IJ SOLN
INTRAMUSCULAR | Status: AC
  Filled 2022-09-16: qty 1

## 2022-09-16 MED ORDER — DIPHENHYDRAMINE HCL 50 MG/ML IJ SOLN: 12.5000 mg | INTRAMUSCULAR | Status: DC | PRN

## 2022-09-16 MED ORDER — FENTANYL-BUPIVACAINE-NACL 0.5-0.125-0.9 MG/250ML-% EP SOLN
12.0000 mL/h | EPIDURAL | Status: DC | PRN
  Administered 2022-09-16: 12 mL/h via EPIDURAL
  Filled 2022-09-16: qty 250

## 2022-09-16 MED ORDER — ONDANSETRON HCL 4 MG/2ML IJ SOLN: 4.0000 mg | INTRAMUSCULAR | Status: DC | PRN

## 2022-09-16 MED ORDER — ZOLPIDEM TARTRATE 5 MG PO TABS: 5.0000 mg | ORAL_TABLET | Freq: Every evening | ORAL | Status: DC | PRN

## 2022-09-16 MED ORDER — ONDANSETRON HCL 4 MG/2ML IJ SOLN: 4.0000 mg | Freq: Four times a day (QID) | INTRAMUSCULAR | Status: DC | PRN

## 2022-09-16 MED ORDER — PRENATAL MULTIVITAMIN CH
1.0000 | ORAL_TABLET | Freq: Every day | ORAL | Status: DC
  Administered 2022-09-17: 1 via ORAL
  Filled 2022-09-16: qty 1

## 2022-09-16 MED ORDER — LACTATED RINGERS IV SOLN: 500.0000 mL | INTRAVENOUS | Status: DC | PRN

## 2022-09-16 MED ORDER — FERROUS SULFATE 325 (65 FE) MG PO TABS
325.0000 mg | ORAL_TABLET | Freq: Two times a day (BID) | ORAL | Status: DC
  Administered 2022-09-17 – 2022-09-18 (×3): 325 mg via ORAL
  Filled 2022-09-16 (×3): qty 1

## 2022-09-16 MED ORDER — EPHEDRINE 5 MG/ML INJ: 10.0000 mg | INTRAVENOUS | Status: DC | PRN

## 2022-09-16 MED ORDER — FLEET ENEMA 7-19 GM/118ML RE ENEM: 1.0000 | ENEMA | RECTAL | Status: DC | PRN

## 2022-09-16 MED ORDER — PHENYLEPHRINE 80 MCG/ML (10ML) SYRINGE FOR IV PUSH (FOR BLOOD PRESSURE SUPPORT): 80.0000 ug | PREFILLED_SYRINGE | INTRAVENOUS | Status: DC | PRN

## 2022-09-16 MED ORDER — OXYTOCIN BOLUS FROM INFUSION
333.0000 mL | Freq: Once | INTRAVENOUS | Status: AC
  Administered 2022-09-16: 333 mL via INTRAVENOUS

## 2022-09-16 MED ORDER — IBUPROFEN 600 MG PO TABS
600.0000 mg | ORAL_TABLET | Freq: Four times a day (QID) | ORAL | Status: DC
  Administered 2022-09-17 – 2022-09-18 (×4): 600 mg via ORAL
  Filled 2022-09-16 (×4): qty 1

## 2022-09-16 MED ORDER — OXYTOCIN-SODIUM CHLORIDE 30-0.9 UT/500ML-% IV SOLN
INTRAVENOUS | Status: AC
  Filled 2022-09-16: qty 500

## 2022-09-16 MED ORDER — DIBUCAINE (PERIANAL) 1 % EX OINT: 1.0000 | TOPICAL_OINTMENT | CUTANEOUS | Status: DC | PRN

## 2022-09-16 MED ORDER — LACTATED RINGERS IV SOLN: INTRAVENOUS | Status: DC

## 2022-09-16 MED ORDER — LIDOCAINE HCL (PF) 1 % IJ SOLN: 30.0000 mL | INTRAMUSCULAR | Status: DC | PRN

## 2022-09-16 MED ORDER — ACETAMINOPHEN 325 MG PO TABS
650.0000 mg | ORAL_TABLET | ORAL | Status: DC | PRN
  Administered 2022-09-17: 650 mg via ORAL
  Filled 2022-09-16: qty 2

## 2022-09-16 MED ORDER — OXYTOCIN-SODIUM CHLORIDE 30-0.9 UT/500ML-% IV SOLN: 2.5000 [IU]/h | INTRAVENOUS | Status: DC

## 2022-09-16 MED ORDER — BENZOCAINE-MENTHOL 20-0.5 % EX AERO: 1.0000 | INHALATION_SPRAY | CUTANEOUS | Status: DC | PRN

## 2022-09-16 MED ORDER — DIPHENHYDRAMINE HCL 25 MG PO CAPS: 25.0000 mg | ORAL_CAPSULE | Freq: Four times a day (QID) | ORAL | Status: DC | PRN

## 2022-09-16 MED ORDER — SENNOSIDES-DOCUSATE SODIUM 8.6-50 MG PO TABS
2.0000 | ORAL_TABLET | Freq: Every day | ORAL | Status: DC
  Administered 2022-09-17: 2 via ORAL
  Filled 2022-09-16: qty 2

## 2022-09-16 MED ORDER — OXYCODONE-ACETAMINOPHEN 5-325 MG PO TABS: 1.0000 | ORAL_TABLET | ORAL | Status: DC | PRN

## 2022-09-16 MED ORDER — SIMETHICONE 80 MG PO CHEW: 80.0000 mg | CHEWABLE_TABLET | ORAL | Status: DC | PRN

## 2022-09-16 MED ORDER — OXYCODONE-ACETAMINOPHEN 5-325 MG PO TABS: 2.0000 | ORAL_TABLET | ORAL | Status: DC | PRN

## 2022-09-16 MED ORDER — ACETAMINOPHEN 325 MG PO TABS: 650.0000 mg | ORAL_TABLET | ORAL | Status: DC | PRN

## 2022-09-16 MED ORDER — LACTATED RINGERS IV SOLN: 500.0000 mL | Freq: Once | INTRAVENOUS | Status: DC

## 2022-09-16 MED ORDER — LIDOCAINE HCL (PF) 1 % IJ SOLN
INTRAMUSCULAR | Status: DC | PRN
  Administered 2022-09-16: 11 mL via EPIDURAL

## 2022-09-16 MED ORDER — METHYLERGONOVINE MALEATE 0.2 MG/ML IJ SOLN: 0.2000 mg | INTRAMUSCULAR | Status: DC | PRN

## 2022-09-16 MED ORDER — SOD CITRATE-CITRIC ACID 500-334 MG/5ML PO SOLN: 30.0000 mL | ORAL | Status: DC | PRN

## 2022-09-16 MED ORDER — COCONUT OIL OIL
1.0000 | TOPICAL_OIL | Status: DC | PRN
  Administered 2022-09-17: 1 via TOPICAL

## 2022-09-16 MED ORDER — ONDANSETRON HCL 4 MG PO TABS: 4.0000 mg | ORAL_TABLET | ORAL | Status: DC | PRN

## 2022-09-16 MED ORDER — WITCH HAZEL-GLYCERIN EX PADS: 1.0000 | MEDICATED_PAD | CUTANEOUS | Status: DC | PRN

## 2022-09-16 MED ORDER — METHYLERGONOVINE MALEATE 0.2 MG PO TABS: 0.2000 mg | ORAL_TABLET | ORAL | Status: DC | PRN

## 2022-09-16 NOTE — MAU Note (Signed)
Erika Kelly is a 27 y.o. at [redacted]w[redacted]d here in MAU reporting: assisted from wc.  Contractions started at 0946, getting closer and stronger.  No bleeding or leaking.  Reports +FM.  No problems with preg.   First labor <3hrs, 2nd was just over an hour.  Was 2/50 last wk  Onset of complaint: 0946 Vitals:   09/16/22 1248  BP: 123/69  Pulse: (!) 107  Resp: 18  Temp: 97.9 F (36.6 C)  SpO2: 99%      Lab orders placed from triage:

## 2022-09-16 NOTE — Lactation Note (Signed)
This note was copied from a baby's chart. Lactation Consultation Note  Patient Name: Erika Kelly [id number] Date: 09/16/2022 Age:27 hours Reason for consult: Initial assessment;Term P3, term female infant, Per Birth Parent, she latched infant twice in L&D and on MBU, she does not like latching but wants infant to have her EBM. Birth Parent decided that she wants to "Pump Only " and formula feed infant. She pumped and formula fed her 1st child for 6 months and her 2nd child for 8 months. Her 2nd child is now 42 years old. LC set up the DEBP and fitted Birth Parent will 24 mm breast flange, Birth Parent was expressing colostrum as LC left the room. Birth Parent knows that EBM is safe for 4 hours at room temperature whereas formula must be used within 1 hour. Birth Parent will continue to feed infant by cues, on demand, 8 to 12+ times within 24 hours.Birth Parent will continue to use DEBP every 3 hours for 15 minutes on initial setting. Birth Parent will continue to offer infant her EBM first before offering formula. Birth Parent has feeding guideline sheet and knows on Day1 to offer (5-15 mls) per feeding or more if infant wants it. LC discussed infant's input and output. The importance of maternal rest, diet and hydration. Birth Parent was  made aware of O/P services, breastfeeding support groups, community resources, and our phone # for post-discharge questions.    Maternal Data    Feeding Mother's Current Feeding Choice: Breast Milk and Formula  LATCH Score   Birth Parent current feeding preference is "Pumping Only" and Formula Feeding infant.                  Lactation Tools Discussed/Used Tools: Pump;Flanges Flange Size: 24 Breast pump type: Double-Electric Breast Pump Pump Education: Setup, frequency, and cleaning;Milk Storage Reason for Pumping: Birth Parent has decided to "Pump Only" and formula feed. Pumping frequency: Birth Parent will continue to pump every 3 hours for 15  minutes on inital setting.  Interventions Interventions: Education;DEBP;Pace feeding;LC Services brochure;Skin to skin  Discharge Pump: DEBP;Hands Free;Personal  Consult Status Consult Status: Follow-up Date: 09/17/22 Follow-up type: In-patient    Frederico Hamman 09/16/2022, 11:47 PM

## 2022-09-16 NOTE — Plan of Care (Signed)
  Problem: Education: Goal: Knowledge of General Education information will improve Description: Including pain rating scale, medication(s)/side effects and non-pharmacologic comfort measures Outcome: Completed/Met   Problem: Health Behavior/Discharge Planning: Goal: Ability to manage health-related needs will improve Outcome: Completed/Met   Problem: Clinical Measurements: Goal: Ability to maintain clinical measurements within normal limits will improve Outcome: Completed/Met Goal: Will remain free from infection Outcome: Completed/Met Goal: Diagnostic test results will improve Outcome: Completed/Met Goal: Respiratory complications will improve Outcome: Completed/Met Goal: Cardiovascular complication will be avoided Outcome: Completed/Met   Problem: Activity: Goal: Risk for activity intolerance will decrease Outcome: Completed/Met   Problem: Nutrition: Goal: Adequate nutrition will be maintained Outcome: Completed/Met   Problem: Coping: Goal: Level of anxiety will decrease Outcome: Completed/Met   Problem: Elimination: Goal: Will not experience complications related to bowel motility Outcome: Completed/Met Goal: Will not experience complications related to urinary retention Outcome: Completed/Met   Problem: Pain Managment: Goal: General experience of comfort will improve Outcome: Completed/Met   Problem: Safety: Goal: Ability to remain free from injury will improve Outcome: Completed/Met   Problem: Skin Integrity: Goal: Risk for impaired skin integrity will decrease Outcome: Completed/Met   Problem: Education: Goal: Knowledge of condition will improve Outcome: Completed/Met Goal: Individualized Educational Video(s) Outcome: Completed/Met Goal: Individualized Newborn Educational Video(s) Outcome: Completed/Met   Problem: Activity: Goal: Will verbalize the importance of balancing activity with adequate rest periods Outcome: Completed/Met Goal: Ability to  tolerate increased activity will improve Outcome: Completed/Met   Problem: Coping: Goal: Ability to identify and utilize available resources and services will improve Outcome: Completed/Met   Problem: Life Cycle: Goal: Chance of risk for complications during the postpartum period will decrease Outcome: Completed/Met   Problem: Role Relationship: Goal: Ability to demonstrate positive interaction with newborn will improve Outcome: Completed/Met   Problem: Skin Integrity: Goal: Demonstration of wound healing without infection will improve Outcome: Completed/Met

## 2022-09-16 NOTE — Anesthesia Preprocedure Evaluation (Signed)
Anesthesia Evaluation  Patient identified by MRN, date of birth, ID band Patient awake    Reviewed: Allergy & Precautions, H&P , NPO status , Patient's Chart, lab work & pertinent test results  Airway Mallampati: II  TM Distance: >3 FB Neck ROM: Full    Dental no notable dental hx.    Pulmonary neg pulmonary ROS   Pulmonary exam normal breath sounds clear to auscultation       Cardiovascular negative cardio ROS Normal cardiovascular exam Rhythm:Regular Rate:Normal     Neuro/Psych negative neurological ROS  negative psych ROS   GI/Hepatic negative GI ROS, Neg liver ROS,,,  Endo/Other  negative endocrine ROSdiabetes    Renal/GU negative Renal ROS  negative genitourinary   Musculoskeletal negative musculoskeletal ROS (+)    Abdominal   Peds negative pediatric ROS (+)  Hematology negative hematology ROS (+)   Anesthesia Other Findings   Reproductive/Obstetrics (+) Pregnancy                             Anesthesia Physical Anesthesia Plan  ASA: 2  Anesthesia Plan: Epidural   Post-op Pain Management:    Induction:   PONV Risk Score and Plan:   Airway Management Planned:   Additional Equipment:   Intra-op Plan:   Post-operative Plan:   Informed Consent:   Plan Discussed with:   Anesthesia Plan Comments:        Anesthesia Quick Evaluation

## 2022-09-16 NOTE — Anesthesia Procedure Notes (Signed)
Epidural Patient location during procedure: OB Start time: 09/16/2022 2:32 PM End time: 09/16/2022 2:44 PM  Staffing Anesthesiologist: Lowella Curb, MD Performed: anesthesiologist   Preanesthetic Checklist Completed: patient identified, IV checked, site marked, risks and benefits discussed, surgical consent, monitors and equipment checked, pre-op evaluation and timeout performed  Epidural Patient position: sitting Prep: ChloraPrep Patient monitoring: heart rate, cardiac monitor, continuous pulse ox and blood pressure Approach: midline Location: L2-L3 Injection technique: LOR saline  Needle:  Needle type: Tuohy  Needle gauge: 17 G Needle length: 9 cm Needle insertion depth: 4 cm Catheter type: closed end flexible Catheter size: 20 Guage Catheter at skin depth: 8 cm Test dose: negative  Assessment Events: blood not aspirated, injection not painful, no injection resistance, no paresthesia and negative IV test  Additional Notes Reason for block:procedure for pain

## 2022-09-16 NOTE — H&P (Signed)
Erika Kelly is a 27 y.o. female G3P2002 at 40 weeks and 0 days presenting with regular contractions. 8 cm upon arrival to MAU. Denies SROM .Pregnancy has been uncomplicated.   Prenatal care provided by Dr. Gerald Leitz with [facility]  OB History     Gravida  3   Para  2   Term  2   Preterm      AB      Living  2      SAB      IAB      Ectopic      Multiple  0   Live Births  2          Past Medical History:  Diagnosis Date   Anemia    Gestational diabetes    Medical history non-contributory    Gestational diabetes with previous pregnancy . Not with current pregnancy.   Past Surgical History:  Procedure Laterality Date   NO PAST SURGERIES     Family History: family history includes Diabetes in her brother and paternal grandfather; Heart disease in her maternal grandfather and paternal grandfather; Hypertension in her father, maternal grandfather, maternal grandmother, mother, and paternal grandfather; Polycystic ovary syndrome in her sister. Social History:  reports that she has never smoked. She has never used smokeless tobacco. She reports that she does not drink alcohol and does not use drugs.     Maternal Diabetes: No Genetic Screening: Normal Maternal Ultrasounds/Referrals: Normal Fetal Ultrasounds or other Referrals:  None Maternal Substance Abuse:  No Significant Maternal Medications:  None Significant Maternal Lab Results:  Group B Strep negative Number of Prenatal Visits:greater than 3 verified prenatal visits Other Comments:  None  Review of Systems  Constitutional: Negative.   HENT: Negative.    Eyes: Negative.   Respiratory: Negative.    Cardiovascular: Negative.   Gastrointestinal: Negative.   Endocrine: Negative.   Genitourinary: Negative.   Musculoskeletal: Negative.   Skin: Negative.   Allergic/Immunologic: Negative.   Neurological: Negative.   Hematological: Negative.   Psychiatric/Behavioral: Negative.     Maternal Medical  History:  Reason for admission: Contractions.   Contractions: Onset was 6-12 hours ago.   Frequency: regular.   Perceived severity is strong.   Fetal activity: Perceived fetal activity is normal.   Prenatal complications: no prenatal complications Prenatal Complications - Diabetes: none.   Dilation: 8 Station: -1 Exam by:: Megan Blood pressure 123/69, pulse (!) 107, temperature 97.9 F (36.6 C), temperature source Oral, resp. rate 18, height 5' 2.25" (1.581 m), weight 56.2 kg, SpO2 99%, unknown if currently breastfeeding. Maternal Exam:  Introitus: Normal vulva.   Physical Exam Vitals reviewed.  Constitutional:      Appearance: Normal appearance.  HENT:     Head: Normocephalic and atraumatic.     Nose: Nose normal.     Mouth/Throat:     Mouth: Mucous membranes are moist.  Cardiovascular:     Rate and Rhythm: Normal rate and regular rhythm.  Pulmonary:     Effort: Pulmonary effort is normal.     Breath sounds: Normal breath sounds.  Abdominal:     Tenderness: There is no abdominal tenderness.  Genitourinary:    General: Normal vulva.  Musculoskeletal:        General: No swelling. Normal range of motion.     Cervical back: Normal range of motion.  Skin:    General: Skin is warm and dry.  Neurological:     General: No focal deficit present.  Mental Status: She is alert and oriented to person, place, and time.  Psychiatric:        Mood and Affect: Mood normal.        Behavior: Behavior normal.     Prenatal labs: ABO, Rh: --/--/PENDING (07/22 1311) Antibody: PENDING (07/22 1311) Rubella:   Immune RPR:   Non-reactive HBsAg:   Negative  HIV:   Negative  GBS:   Negative on 08/22/2022  Assessment/Plan: 40 weeks and 0 days active labor  - AROM clear fluid  -Pain control pt desires epidural  - GBS negative  - Fetal Wellbeing Category 1 - Anticipate SVD    Gerald Leitz 09/16/2022, 1:55 PM

## 2022-09-17 LAB — CBC
HCT: 35.2 % — ABNORMAL LOW (ref 36.0–46.0)
Hemoglobin: 11.9 g/dL — ABNORMAL LOW (ref 12.0–15.0)
MCH: 28.8 pg (ref 26.0–34.0)
MCHC: 33.8 g/dL (ref 30.0–36.0)
MCV: 85.2 fL (ref 80.0–100.0)
Platelets: 110 10*3/uL — ABNORMAL LOW (ref 150–400)
RBC: 4.13 MIL/uL (ref 3.87–5.11)
RDW: 14.9 % (ref 11.5–15.5)
WBC: 7.3 10*3/uL (ref 4.0–10.5)
nRBC: 0 % (ref 0.0–0.2)

## 2022-09-17 LAB — RPR: RPR Ser Ql: NONREACTIVE

## 2022-09-17 NOTE — Progress Notes (Signed)
Post Partum Day 1 Subjective: no complaints, up ad lib, voiding, tolerating PO, and + flatus  Objective: Blood pressure 98/70, pulse (!) 57, temperature 98.1 F (36.7 C), temperature source Oral, resp. rate 17, height 5' 2.25" (1.581 m), weight 56.2 kg, SpO2 100%, unknown if currently breastfeeding.  Physical Exam:  General: alert, cooperative, and no distress Lochia: appropriate Uterine Fundus: firm Incision: NA DVT Evaluation: No evidence of DVT seen on physical exam.  Recent Labs    09/16/22 1827 09/17/22 0609  HGB 12.7 11.9*  HCT 38.5 35.2*    Assessment/Plan: Plan for discharge tomorrow and Breastfeeding Routine Postpartum care   LOS: 1 day   Gerald Leitz, MD 09/17/2022, 8:29 AM

## 2022-09-17 NOTE — Anesthesia Postprocedure Evaluation (Signed)
Anesthesia Post Note  Patient: Hosie Spangle  Procedure(s) Performed: AN AD HOC LABOR EPIDURAL     Patient location during evaluation: Mother Baby Anesthesia Type: Epidural Level of consciousness: awake and alert and oriented Pain management: satisfactory to patient Vital Signs Assessment: post-procedure vital signs reviewed and stable Respiratory status: respiratory function stable Cardiovascular status: stable Postop Assessment: no headache, no backache, epidural receding, patient able to bend at knees, no signs of nausea or vomiting, adequate PO intake and able to ambulate Anesthetic complications: no   No notable events documented.  Last Vitals:  Vitals:   09/16/22 2101 09/17/22 0546  BP: 116/74 98/70  Pulse: 71 (!) 57  Resp: 17 17  Temp: 36.7 C 36.7 C  SpO2: 100% 100%    Last Pain:  Vitals:   09/17/22 1148  TempSrc:   PainSc: 0-No pain   Pain Goal:                   Maleeya Peterkin

## 2022-09-18 MED ORDER — IBUPROFEN 600 MG PO TABS: 600.0000 mg | ORAL_TABLET | Freq: Four times a day (QID) | ORAL | 0 refills | Status: AC | PRN

## 2022-09-18 MED ORDER — ACETAMINOPHEN 325 MG PO TABS: 650.0000 mg | ORAL_TABLET | Freq: Four times a day (QID) | ORAL | Status: AC | PRN

## 2022-09-18 NOTE — Discharge Summary (Signed)
Postpartum Discharge Summary  Date of Service updated 09/18/2022     Patient Name: Erika Kelly DOB: April 05, 1995 MRN: [id number]  Date of admission: 09/16/2022 Delivery date:09/16/2022 Delivering provider: Gerald Leitz Date of discharge: 09/18/2022  Admitting diagnosis: Normal labor [O80, Z37.9] Intrauterine pregnancy: [redacted]w[redacted]d     Secondary diagnosis:  Active Problems:   Normal labor  Additional problems: None    Discharge diagnosis: Term Pregnancy Delivered                                              Post partum procedures: none Augmentation: AROM Complications: None  Hospital course: Onset of Labor With Vaginal Delivery      27 y.o. yo G3P3003 at [redacted]w[redacted]d was admitted in Active Labor on 09/16/2022. Labor course was complicated by uncomplicated  Membrane Rupture Time/Date: 1:01 PM,09/16/2022  Delivery Method:Vaginal, Spontaneous Episiotomy: None Lacerations:  None Patient had a postpartum course complicated by uncomplicated.  She is ambulating, tolerating a regular diet, passing flatus, and urinating well. Patient is discharged home in stable condition on 09/18/22.  Newborn Data: Birth date:09/16/2022 Birth time:5:45 PM Gender:Female Living status:Living Apgars:9 ,9  Weight:3062 g  Magnesium Sulfate received: No BMZ received: No Rhophylac:N/A MMR:N/A T-DaP:Given prenatally Flu: N/A Transfusion:No  Physical exam  Vitals:   09/17/22 1010 09/17/22 1405 09/17/22 2024 09/18/22 0502  BP: 104/68 100/65 106/68 109/70  Pulse: 64 62 (!) 57 61  Resp: 17 17 14 18   Temp: 98.1 F (36.7 C) 98.2 F (36.8 C) 98.2 F (36.8 C) 98 F (36.7 C)  TempSrc:  Oral Oral Oral  SpO2: 100% 100%    Weight:      Height:       General: alert, cooperative, and no distress Lochia: appropriate Uterine Fundus: firm Incision: N/A DVT Evaluation: No evidence of DVT seen on physical exam. Labs: Lab Results  Component Value Date   WBC 7.3 09/17/2022   HGB 11.9 (L) 09/17/2022   HCT 35.2 (L)  09/17/2022   MCV 85.2 09/17/2022   PLT 110 (L) 09/17/2022      Latest Ref Rng & Units 07/20/2021    3:18 PM  CMP  Glucose 65 - 99 mg/dL 83   BUN 7 - 25 mg/dL 13   Creatinine 6.57 - 0.96 mg/dL 8.46   Sodium 962 - 952 mmol/L 137   Potassium 3.5 - 5.3 mmol/L 3.8   Chloride 98 - 110 mmol/L 105   CO2 20 - 32 mmol/L 24   Calcium 8.6 - 10.2 mg/dL 9.4   Total Protein 6.1 - 8.1 g/dL 7.1   Total Bilirubin 0.2 - 1.2 mg/dL 0.6   AST 10 - 30 U/L 17   ALT 6 - 29 U/L 17    Edinburgh Score:    09/17/2022    6:18 PM  Edinburgh Postnatal Depression Scale Screening Tool  I have been able to laugh and see the funny side of things. 0  I have looked forward with enjoyment to things. 0  I have blamed myself unnecessarily when things went wrong. 0  I have been anxious or worried for no good reason. 0  I have felt scared or panicky for no good reason. 0  Things have been getting on top of me. 0  I have been so unhappy that I have had difficulty sleeping. 0  I have felt sad or miserable. 0  I have been so unhappy that I have been crying. 0  The thought of harming myself has occurred to me. 0  Edinburgh Postnatal Depression Scale Total 0      After visit meds:  Allergies as of 09/18/2022   No Known Allergies      Medication List     STOP taking these medications    ferrous sulfate 325 (65 FE) MG EC tablet       TAKE these medications    acetaminophen 325 MG tablet Commonly known as: Tylenol Take 2 tablets (650 mg total) by mouth every 6 (six) hours as needed (for pain scale < 4).   ibuprofen 600 MG tablet Commonly known as: ADVIL Take 1 tablet (600 mg total) by mouth every 6 (six) hours as needed.   Vitamin D (Ergocalciferol) 1.25 MG (50000 UNIT) Caps capsule Commonly known as: DRISDOL Take 1 capsule (50,000 Units total) by mouth every 7 (seven) days. Please change to this location from [facility] in [place] per patient's request         Discharge home in stable  condition Infant Feeding: Bottle and Breast Infant Disposition:home with mother Discharge instruction: per After Visit Summary and Postpartum booklet. Activity: Advance as tolerated. Pelvic rest for 6 weeks.  Diet: routine diet Anticipated Birth Control: Unsure Postpartum Appointment:6 weeks Additional Postpartum F/U:  none Future Appointments:No future appointments. Follow up Visit:  Follow-up Information     Gerald Leitz, MD. Schedule an appointment as soon as possible for a visit in 6 week(s).   Specialty: Obstetrics and Gynecology Why: please make an appointment for postpartum visit in 6 weeks Contact information: [address] [place] [address] [phone]                     09/18/2022 Gerald Leitz, MD

## 2022-10-14 ENCOUNTER — Telehealth (HOSPITAL_COMMUNITY): Payer: Self-pay | Admitting: *Deleted

## 2022-10-14 NOTE — Telephone Encounter (Signed)
10/14/2022  Name: Erika Kelly MRN: [id number] DOB: August 11, 1995  Reason for Call:  Transition of Care Hospital Discharge Call  Contact Status: Patient Contact Status: Complete  Language assistant needed: Interpreter Mode: Interpreter Not Needed        Follow-Up Questions: Do You Have Any Concerns About Your Health As You Heal From Delivery?: Yes What Concerns Do You Have About Your Health?: She is losing weight. Thinks she has a high metabolism.  F/U appt with OB is in the first week of September.  Encouraged her to keep that appointment as weight would be one thing measured and if there is an issue, the OB can address it at that time. Do You Have Any Concerns About Your Infants Health?: No  Edinburgh Postnatal Depression Scale:  In the Past 7 Days: I have been able to laugh and see the funny side of things.: As much as I always could I have looked forward with enjoyment to things.: As much as I ever did I have blamed myself unnecessarily when things went wrong.: No, never I have been anxious or worried for no good reason.: No, not at all I have felt scared or panicky for no good reason.: No, not at all Things have been getting on top of me.: No, I have been coping as well as ever I have been so unhappy that I have had difficulty sleeping.: Not at all I have felt sad or miserable.: No, not at all I have been so unhappy that I have been crying.: No, never The thought of harming myself has occurred to me.: Never Inocente Salles Postnatal Depression Scale Total: 0  PHQ2-9 Depression Scale:     Discharge Follow-up: Edinburgh score requires follow up?: No Patient was advised of the following resources:: Breastfeeding Support Group, Support Group  Post-discharge interventions: Reviewed Newborn Safe Sleep Practices  Salena Saner, RN 10/14/2022 13:20
